# Patient Record
Sex: Female | Born: 1990 | Race: Black or African American | Hispanic: No | Marital: Married
Health system: Midwestern US, Community
[De-identification: ages and names within clinical notes are randomized; demographics above are authoritative.]

## PROBLEM LIST (undated history)

## (undated) ENCOUNTER — Inpatient Hospital Stay (HOSPITAL_COMMUNITY): Payer: Self-pay

## (undated) DIAGNOSIS — Z915 Personal history of self-harm: Secondary | ICD-10-CM

## (undated) DIAGNOSIS — F1210 Cannabis abuse, uncomplicated: Secondary | ICD-10-CM

## (undated) DIAGNOSIS — Z9151 Personal history of suicidal behavior: Secondary | ICD-10-CM

## (undated) DIAGNOSIS — F209 Schizophrenia, unspecified: Secondary | ICD-10-CM

## (undated) DIAGNOSIS — R569 Unspecified convulsions: Secondary | ICD-10-CM

## (undated) DIAGNOSIS — F1410 Cocaine abuse, uncomplicated: Secondary | ICD-10-CM

## (undated) DIAGNOSIS — F319 Bipolar disorder, unspecified: Secondary | ICD-10-CM

## (undated) HISTORY — PX: WISDOM TOOTH EXTRACTION: SHX21

## (undated) HISTORY — PX: NO PAST SURGERIES: SHX2092

---

## 2014-04-27 ENCOUNTER — Emergency Department (HOSPITAL_COMMUNITY)
Admission: EM | Admit: 2014-04-27 | Discharge: 2014-04-27 | Disposition: A | Discharge status: Home / Self Care | Payer: Medicaid Other | Attending: Emergency Medicine | Admitting: Emergency Medicine

## 2014-04-27 ENCOUNTER — Encounter (HOSPITAL_COMMUNITY): Payer: Self-pay | Admitting: Emergency Medicine

## 2014-04-27 ENCOUNTER — Emergency Department (HOSPITAL_COMMUNITY): Payer: Medicaid Other

## 2014-04-27 DIAGNOSIS — N760 Acute vaginitis: Secondary | ICD-10-CM | POA: Insufficient documentation

## 2014-04-27 DIAGNOSIS — N972 Female infertility of uterine origin: Secondary | ICD-10-CM | POA: Diagnosis present

## 2014-04-27 DIAGNOSIS — O209 Hemorrhage in early pregnancy, unspecified: Secondary | ICD-10-CM | POA: Insufficient documentation

## 2014-04-27 DIAGNOSIS — N390 Urinary tract infection, site not specified: Secondary | ICD-10-CM | POA: Diagnosis not present

## 2014-04-27 DIAGNOSIS — B9689 Other specified bacterial agents as the cause of diseases classified elsewhere: Secondary | ICD-10-CM | POA: Insufficient documentation

## 2014-04-27 DIAGNOSIS — O2390 Unspecified genitourinary tract infection in pregnancy, unspecified trimester: Secondary | ICD-10-CM | POA: Diagnosis not present

## 2014-04-27 DIAGNOSIS — A499 Bacterial infection, unspecified: Secondary | ICD-10-CM | POA: Insufficient documentation

## 2014-04-27 DIAGNOSIS — IMO0002 Reserved for concepts with insufficient information to code with codable children: Secondary | ICD-10-CM | POA: Diagnosis not present

## 2014-04-27 DIAGNOSIS — Z880 Allergy status to penicillin: Secondary | ICD-10-CM | POA: Insufficient documentation

## 2014-04-27 DIAGNOSIS — R300 Dysuria: Secondary | ICD-10-CM | POA: Diagnosis not present

## 2014-04-27 DIAGNOSIS — Z79899 Other long term (current) drug therapy: Secondary | ICD-10-CM | POA: Diagnosis not present

## 2014-04-27 LAB — URINE MICROSCOPIC-ADD ON

## 2014-04-27 LAB — I-STAT CHEM 8, ED
BUN: 4 mg/dL — AB (ref 6–23)
Calcium, Ion: 1.18 mmol/L (ref 1.12–1.23)
Chloride: 103 mEq/L (ref 96–112)
Creatinine, Ser: 0.6 mg/dL (ref 0.50–1.10)
Glucose, Bld: 81 mg/dL (ref 70–99)
HEMATOCRIT: 40 % (ref 36.0–46.0)
Hemoglobin: 13.6 g/dL (ref 12.0–15.0)
Potassium: 3.6 mEq/L — ABNORMAL LOW (ref 3.7–5.3)
Sodium: 137 mEq/L (ref 137–147)
TCO2: 23 mmol/L (ref 0–100)

## 2014-04-27 LAB — CBC WITH DIFFERENTIAL/PLATELET
Basophils Absolute: 0 10*3/uL (ref 0.0–0.1)
Basophils Relative: 0 % (ref 0–1)
EOS PCT: 0 % (ref 0–5)
Eosinophils Absolute: 0 10*3/uL (ref 0.0–0.7)
HEMATOCRIT: 36.2 % (ref 36.0–46.0)
HEMOGLOBIN: 12.6 g/dL (ref 12.0–15.0)
LYMPHS PCT: 28 % (ref 12–46)
Lymphs Abs: 3.2 10*3/uL (ref 0.7–4.0)
MCH: 29.4 pg (ref 26.0–34.0)
MCHC: 34.8 g/dL (ref 30.0–36.0)
MCV: 84.6 fL (ref 78.0–100.0)
MONO ABS: 0.7 10*3/uL (ref 0.1–1.0)
MONOS PCT: 6 % (ref 3–12)
Neutro Abs: 7.4 10*3/uL (ref 1.7–7.7)
Neutrophils Relative %: 66 % (ref 43–77)
Platelets: 252 10*3/uL (ref 150–400)
RBC: 4.28 MIL/uL (ref 3.87–5.11)
RDW: 13.5 % (ref 11.5–15.5)
WBC: 11.3 10*3/uL — ABNORMAL HIGH (ref 4.0–10.5)

## 2014-04-27 LAB — URINALYSIS, ROUTINE W REFLEX MICROSCOPIC
Bilirubin Urine: NEGATIVE
GLUCOSE, UA: NEGATIVE mg/dL
HGB URINE DIPSTICK: NEGATIVE
Ketones, ur: 15 mg/dL — AB
Nitrite: POSITIVE — AB
PROTEIN: 30 mg/dL — AB
Specific Gravity, Urine: 1.025 (ref 1.005–1.030)
Urobilinogen, UA: 1 mg/dL (ref 0.0–1.0)
pH: 6.5 (ref 5.0–8.0)

## 2014-04-27 LAB — HEPATIC FUNCTION PANEL
ALT: 14 U/L (ref 0–35)
AST: 14 U/L (ref 0–37)
Albumin: 3.9 g/dL (ref 3.5–5.2)
Alkaline Phosphatase: 56 U/L (ref 39–117)
BILIRUBIN TOTAL: 0.4 mg/dL (ref 0.3–1.2)
Total Protein: 7.4 g/dL (ref 6.0–8.3)

## 2014-04-27 LAB — WET PREP, GENITAL
TRICH WET PREP: NONE SEEN
WBC WET PREP: NONE SEEN
Yeast Wet Prep HPF POC: NONE SEEN

## 2014-04-27 LAB — HCG, QUANTITATIVE, PREGNANCY: HCG, BETA CHAIN, QUANT, S: 40503 m[IU]/mL — AB (ref <5)

## 2014-04-27 LAB — ABO/RH: ABO/RH(D): A POS

## 2014-04-27 LAB — TYPE AND SCREEN
ABO/RH(D): A POS
Antibody Screen: NEGATIVE

## 2014-04-27 LAB — PREGNANCY, URINE: Preg Test, Ur: POSITIVE — AB

## 2014-04-27 MED ORDER — METRONIDAZOLE 500 MG PO TABS
500.0000 mg | ORAL_TABLET | Freq: Two times a day (BID) | ORAL | Status: DC
Start: 2014-04-27 — End: 2015-03-24

## 2014-04-27 MED ORDER — SODIUM CHLORIDE 0.9 % IV BOLUS (SEPSIS)
1000.0000 mL | Freq: Once | INTRAVENOUS | Status: AC
  Administered 2014-04-27: 1000 mL via INTRAVENOUS

## 2014-04-27 MED ORDER — NITROFURANTOIN MONOHYD MACRO 100 MG PO CAPS
100.0000 mg | ORAL_CAPSULE | Freq: Two times a day (BID) | ORAL | Status: DC
Start: 2014-04-27 — End: 2015-03-24

## 2014-04-27 MED ORDER — ACETAMINOPHEN 325 MG PO TABS
650.0000 mg | ORAL_TABLET | Freq: Once | ORAL | Status: AC
  Administered 2014-04-27: 650 mg via ORAL
  Filled 2014-04-27: qty 2

## 2014-04-27 NOTE — ED Notes (Signed)
Pelvic cart set up at bedside. Pt undressed from waist down awaiting exam. Pt placed on monitor Pt monitored by blood pressure and pulse ox.

## 2014-04-27 NOTE — ED Notes (Signed)
Pt states increased abdominal pain after vaginal exam

## 2014-04-27 NOTE — ED Provider Notes (Signed)
CSN: [id number]     Arrival date & time 04/27/14  1315 History   First MD Initiated Contact with Patient 04/27/14 1522     Chief Complaint  Patient presents with  . Vaginal Bleeding  . Abdominal Cramping    Patient is a 23 y.o. female presenting with vaginal bleeding. The history is provided by the patient.  Vaginal Bleeding Quality:  Lighter than menses (No tissue) Severity:  Mild Onset quality:  Gradual Duration:  1 day Timing:  Intermittent Progression:  Unchanged Chronicity:  New Number of pads used:  1 Possible pregnancy: yes ([redacted]wks EGA)   Context: not after intercourse, not foreign body and not genital trauma   Relieved by:  None tried Worsened by:  Nothing tried Ineffective treatments:  None tried Associated symptoms: abdominal pain and dysuria   Associated symptoms: no back pain, no dizziness, no fatigue, no fever, no nausea and no vaginal discharge   Risk factors: prior miscarriage   Risk factors: no bleeding disorder, no hx of ectopic pregnancy and no gynecological surgery    [id number] 23 y.o. female who presents for VB and cramping since last night.  No drug use. Says she fell onto abdomen recently.  Blood type A +.     History reviewed. No pertinent past medical history. History reviewed. No pertinent past surgical history. History reviewed. No pertinent family history. History  Substance Use Topics  . Smoking status: Current Every Day Smoker  . Smokeless tobacco: Not on file  . Alcohol Use: No   Review of Systems  Constitutional: Negative for fever and fatigue.  Respiratory: Negative for chest tightness and shortness of breath.   Gastrointestinal: Positive for abdominal pain. Negative for nausea and vomiting.  Genitourinary: Positive for dysuria, vaginal bleeding and pelvic pain. Negative for flank pain, vaginal discharge and difficulty urinating.  Musculoskeletal: Negative for back pain.  Neurological: Negative for dizziness, syncope and light-headedness.     Allergies  Chocolate; Other; Peanut-containing drug products; and Penicillins  Home Medications   Prior to Admission medications   Medication Sig Start Date End Date Taking? Authorizing Provider  acetaminophen (TYLENOL) 500 MG tablet Take 1,000 mg by mouth every 6 (six) hours as needed for moderate pain or headache.   Yes Historical Provider, MD  Prenatal Vit-Fe Fumarate-FA (PRENATAL PO) Take 1 tablet by mouth daily.   Yes Historical Provider, MD   BP 115/66  Pulse 90  Temp(Src) 99.1 F (37.3 C) (Oral)  Resp 18  Ht  (1.651 m)  Wt 151 lb (68.493 kg)  BMI 25.13 kg/m2  SpO2 99% Physical Exam  Nursing note and vitals reviewed. Constitutional: She is oriented to person, place, and time. She appears well-developed and well-nourished. No distress.  HENT:  Head: Normocephalic and atraumatic.  Nose: Nose normal.  Eyes: Conjunctivae are normal.  No conj pallor  Neck: Normal range of motion. Neck supple. No tracheal deviation present.  Cardiovascular: Normal rate, regular rhythm and normal heart sounds.   No murmur heard. Pulmonary/Chest: Effort normal and breath sounds normal. No respiratory distress. She has no rales.  Abdominal: Soft. Bowel sounds are normal. She exhibits no distension and no mass. There is tenderness (b/l lower quadrants).  Neg psoas, Rovsign, obturator signs  Genitourinary:  Os closed, no bleeding. No dc per os, but vagina with clear discharge with white clumps. No erythema. Cervix w/o lesions  Musculoskeletal: Normal range of motion. She exhibits no edema.  Neurological: She is alert and oriented to person, place, and time.  Skin: Skin is warm and dry. No rash noted.  Psychiatric: She has a normal mood and affect.    ED Course  Procedures (including critical care time) Labs Review Labs Reviewed  WET PREP, GENITAL - Abnormal; Notable for the following:    Clue Cells Wet Prep HPF POC FEW (*)    All other components within normal limits  URINALYSIS,  ROUTINE W REFLEX MICROSCOPIC - Abnormal; Notable for the following:    Color, Urine AMBER (*)    APPearance CLOUDY (*)    Ketones, ur 15 (*)    Protein, ur 30 (*)    Nitrite POSITIVE (*)    Leukocytes, UA SMALL (*)    All other components within normal limits  HCG, QUANTITATIVE, PREGNANCY - Abnormal; Notable for the following:    hCG, Beta Chain, Quant, S 16109 (*)    All other components within normal limits  CBC WITH DIFFERENTIAL - Abnormal; Notable for the following:    WBC 11.3 (*)    All other components within normal limits  PREGNANCY, URINE - Abnormal; Notable for the following:    Preg Test, Ur POSITIVE (*)    All other components within normal limits  URINE MICROSCOPIC-ADD ON - Abnormal; Notable for the following:    Squamous Epithelial / LPF FEW (*)    Bacteria, UA MANY (*)    All other components within normal limits  I-STAT CHEM 8, ED - Abnormal; Notable for the following:    Potassium 3.6 (*)    BUN 4 (*)    All other components within normal limits  URINE CULTURE  GC/CHLAMYDIA PROBE AMP  HEPATIC FUNCTION PANEL  TYPE AND SCREEN  ABO/RH    Imaging Review US Ob Comp Less 14 Wks  04/27/2014   CLINICAL DATA:  Abnormal stress abdominal cramping and vaginal bleeding.  EXAM: OBSTETRIC <14 WK [place] AND TRANSVAGINAL OB US  TECHNIQUE: Both transabdominal and transvaginal ultrasound examinations were performed for complete evaluation of the gestation as well as the maternal uterus, adnexal regions, and pelvic cul-de-sac. Transvaginal technique was performed to assess early pregnancy.  COMPARISON:  Pelvic ultrasound 04/07/2014  FINDINGS: Intrauterine gestational sac: Visualized/normal in shape.  Yolk sac:  Present  Embryo:  Present  Cardiac Activity: Present  Heart Rate:  147 bpm  CRL:   8  mm   6 w 5 d                  [place] EDC: 12/15/2013  Maternal uterus/adnexae: No subchorionic hematoma. The right and left ovary are grossly unremarkable.  IMPRESSION: Single live intrauterine  gestation 6 weeks 5 days by LMP.   Electronically Signed   By: Annia Belt M.D.   On: 04/27/2014 16:46   US Ob Transvaginal  04/27/2014   CLINICAL DATA:  Abnormal stress abdominal cramping and vaginal bleeding.  EXAM: OBSTETRIC <14 WK [place] AND TRANSVAGINAL OB US  TECHNIQUE: Both transabdominal and transvaginal ultrasound examinations were performed for complete evaluation of the gestation as well as the maternal uterus, adnexal regions, and pelvic cul-de-sac. Transvaginal technique was performed to assess early pregnancy.  COMPARISON:  Pelvic ultrasound 04/07/2014  FINDINGS: Intrauterine gestational sac: Visualized/normal in shape.  Yolk sac:  Present  Embryo:  Present  Cardiac Activity: Present  Heart Rate:  147 bpm  CRL:   8  mm   6 w 5 d                  [place] EDC: 12/15/2013  Maternal  uterus/adnexae: No subchorionic hematoma. The right and left ovary are grossly unremarkable.  IMPRESSION: Single live intrauterine gestation 6 weeks 5 days by LMP.   Electronically Signed   By: Annia Belt M.D.   On: 04/27/2014 16:46     EKG Interpretation None      MDM   Final diagnoses:  Vaginal bleeding in pregnancy, first trimester  UTI (lower urinary tract infection)  BV (bacterial vaginosis)    [id number] with 1 day small VB with pelvic cramping, feels like prior miscarriage. Gave NS bolus for cramping. Notes dysuria and had UTI 2 wks ago which was treated.  Endorses mild trauma, tripping and falling onto abdomen. VSS. A+ blood type, no RhoGam needed. Abd benign.  No signs of systemic toxicity or peritonits to suggest surgical emergency.  Pelvic exam without bleeding, os closed, white and clear discharge in vaginal vault.    Ectopic vs miscarriage. Doubt appy, non toxic appearing, no appy signs.   UA postive for infx markers. Treat for UTI and BV in setting of pregnancy with Macrobid and Flagyl. Pelvix u/s confirms intrauterine pregnancy at [redacted]w[redacted]d with +FHT.   F/u in 2 days with Neal Dy,  MD 04/28/14 [phone]

## 2014-04-27 NOTE — ED Provider Notes (Signed)
Pt seen and evaluated.  D/W Dr. Lillard Anes.  Pt with closed os.  IUP via [place].  Plan is home.  OB f/u (pt has appointment 9-9 at [facility]).  Benign exam. Hemodynamically stable. Was hydrated here.  Rolland Porter, MD 04/27/14 [phone]

## 2014-04-27 NOTE — ED Notes (Signed)
Reports vaginal bleeding and cramping since last night, pt is [redacted] weeks pregnant.

## 2014-04-27 NOTE — ED Notes (Signed)
Pt placed on monitor upon arrival to room. Pt monitored by blood pressure and pulse ox.  

## 2014-04-27 NOTE — ED Notes (Signed)
Pregnancy urine sent to main lab as add on

## 2014-04-28 LAB — GC/CHLAMYDIA PROBE AMP
CT PROBE, AMP APTIMA: NEGATIVE
GC Probe RNA: NEGATIVE

## 2014-04-30 LAB — URINE CULTURE

## 2014-05-01 ENCOUNTER — Telehealth (HOSPITAL_BASED_OUTPATIENT_CLINIC_OR_DEPARTMENT_OTHER): Payer: Self-pay

## 2014-05-01 NOTE — Telephone Encounter (Signed)
Post ED Visit - Positive Culture Follow-up  Culture report reviewed by antimicrobial stewardship pharmacist:  Wes Dulaney, Pharm.D., BCPS  Celedonio Miyamoto, [address].D., BCPS  Georgina Pillion, [address].D., BCPS  [place], [address].D., BCPS, AAHIVP  Estella Husk, Pharm.D., BCPS, AAHIVP  Red Christians, Pharm.D.  Tennis Must, [place].D.  Positive Urine culture, >/= 100,000 colonies -> E Coli Treated with Nitrofurantoin , organism sensitive to the same and no further patient follow-up is required at this time.  Arvid Right 05/01/2014, 10:22 PM

## 2014-05-04 NOTE — ED Provider Notes (Signed)
I saw and evaluated the patient, reviewed the resident's note and I agree with the findings and plan.   EKG Interpretation None      Please see my additional dictation  Rolland Porter, MD 05/04/14 2115

## 2014-07-04 ENCOUNTER — Encounter (HOSPITAL_COMMUNITY): Payer: Self-pay | Admitting: Emergency Medicine

## 2014-11-17 ENCOUNTER — Encounter (HOSPITAL_COMMUNITY): Payer: Self-pay | Admitting: *Deleted

## 2014-11-17 ENCOUNTER — Inpatient Hospital Stay (HOSPITAL_COMMUNITY)
Admission: AD | Admit: 2014-11-17 | Discharge: 2014-11-17 | Disposition: A | Discharge status: Home / Self Care | Payer: Medicaid Other | Source: Ambulatory Visit | Attending: Obstetrics and Gynecology | Admitting: Obstetrics and Gynecology

## 2014-11-17 DIAGNOSIS — Z3A37 37 weeks gestation of pregnancy: Secondary | ICD-10-CM | POA: Diagnosis not present

## 2014-11-17 HISTORY — DX: Unspecified convulsions: R56.9

## 2014-11-17 NOTE — MAU Note (Signed)
Urine in lab 

## 2014-11-17 NOTE — MAU Note (Signed)
[place] [facility] [place], was up here at boyfriends. Has been contracting all day, now about 7min apart.  Stayed about the same all day. No  Bleeding or leaking.   Was not dilated when last checked. Treated last wk for chlamydia

## 2014-11-19 ENCOUNTER — Inpatient Hospital Stay (HOSPITAL_COMMUNITY)
Admission: AD | Admit: 2014-11-19 | Discharge: 2014-11-19 | Disposition: A | Discharge status: Home / Self Care | Payer: Medicaid Other | Source: Ambulatory Visit | Attending: Obstetrics and Gynecology | Admitting: Obstetrics and Gynecology

## 2014-11-19 ENCOUNTER — Encounter (HOSPITAL_COMMUNITY): Payer: Self-pay

## 2014-11-19 DIAGNOSIS — Z3A37 37 weeks gestation of pregnancy: Secondary | ICD-10-CM | POA: Insufficient documentation

## 2014-11-19 DIAGNOSIS — O368130 Decreased fetal movements, third trimester, not applicable or unspecified: Secondary | ICD-10-CM | POA: Diagnosis not present

## 2014-11-19 NOTE — MAU Note (Signed)
Pt presents complaining of contractions every 2-3 minutes since Friday. Denies leaking or bleeding. Reports decreased fetal movement. [place] but [facility]

## 2014-11-19 NOTE — Discharge Instructions (Signed)
Third Trimester of Pregnancy °The third trimester is from week 29 through week 42, months 7 through 9. The third trimester is a time when the fetus is growing rapidly. At the end of the ninth month, the fetus is about 20 inches in length and weighs 6-10 pounds.  °BODY CHANGES °Your body goes through many changes during pregnancy. The changes vary from woman to woman.  °· Your weight will continue to increase. You can expect to gain 25-35 pounds (11-16 kg) by the end of the pregnancy. °· You may begin to get stretch marks on your hips, abdomen, and breasts. °· You may urinate more often because the fetus is moving lower into your pelvis and pressing on your bladder. °· You may develop or continue to have heartburn as a result of your pregnancy. °· You may develop constipation because certain hormones are causing the muscles that push waste through your intestines to slow down. °· You may develop hemorrhoids or swollen, bulging veins (varicose veins). °· You may have pelvic pain because of the weight gain and pregnancy hormones relaxing your joints between the bones in your pelvis. Backaches may result from overexertion of the muscles supporting your posture. °· You may have changes in your hair. These can include thickening of your hair, rapid growth, and changes in texture. Some women also have hair loss during or after pregnancy, or hair that feels dry or thin. Your hair will most likely return to normal after your baby is born. °· Your breasts will continue to grow and be tender. A yellow discharge may leak from your breasts called colostrum. °· Your belly button may stick out. °· You may feel short of breath because of your expanding uterus. °· You may notice the fetus "dropping," or moving lower in your abdomen. °· You may have a bloody mucus discharge. This usually occurs a few days to a week before labor begins. °· Your cervix becomes thin and soft (effaced) near your due date. °WHAT TO EXPECT AT YOUR PRENATAL  EXAMS  °You will have prenatal exams every 2 weeks until week 36. Then, you will have weekly prenatal exams. During a routine prenatal visit: °· You will be weighed to make sure you and the fetus are growing normally. °· Your blood pressure is taken. °· Your abdomen will be measured to track your baby's growth. °· The fetal heartbeat will be listened to. °· Any test results from the previous visit will be discussed. °· You may have a cervical check near your due date to see if you have effaced. °At around 36 weeks, your caregiver will check your cervix. At the same time, your caregiver will also perform a test on the secretions of the vaginal tissue. This test is to determine if a type of bacteria, Group B streptococcus, is present. Your caregiver will explain this further. °Your caregiver may ask you: °· What your birth plan is. °· How you are feeling. °· If you are feeling the baby move. °· If you have had any abnormal symptoms, such as leaking fluid, bleeding, severe headaches, or abdominal cramping. °· If you have any questions. °Other tests or screenings that may be performed during your third trimester include: °· Blood tests that check for low iron levels (anemia). °· Fetal testing to check the health, activity level, and growth of the fetus. Testing is done if you have certain medical conditions or if there are problems during the pregnancy. °FALSE LABOR °You may feel small, irregular contractions that   eventually go away. These are called Braxton Hicks contractions, or false labor. Contractions may last for hours, days, or even weeks before true labor sets in. If contractions come at regular intervals, intensify, or become painful, it is best to be seen by your caregiver.  °SIGNS OF LABOR  °· Menstrual-like cramps. °· Contractions that are 5 minutes apart or less. °· Contractions that start on the top of the uterus and spread down to the lower abdomen and back. °· A sense of increased pelvic pressure or back  pain. °· A watery or bloody mucus discharge that comes from the vagina. °If you have any of these signs before the 37th week of pregnancy, call your caregiver right away. You need to go to the hospital to get checked immediately. °HOME CARE INSTRUCTIONS  °· Avoid all smoking, herbs, alcohol, and unprescribed drugs. These chemicals affect the formation and growth of the baby. °· Follow your caregiver's instructions regarding medicine use. There are medicines that are either safe or unsafe to take during pregnancy. °· Exercise only as directed by your caregiver. Experiencing uterine cramps is a good sign to stop exercising. °· Continue to eat regular, healthy meals. °· Wear a good support bra for breast tenderness. °· Do not use hot tubs, steam rooms, or saunas. °· Wear your seat belt at all times when driving. °· Avoid raw meat, uncooked cheese, cat litter boxes, and soil used by cats. These carry germs that can cause birth defects in the baby. °· Take your prenatal vitamins. °· Try taking a stool softener (if your caregiver approves) if you develop constipation. Eat more high-fiber foods, such as fresh vegetables or fruit and whole grains. Drink plenty of fluids to keep your urine clear or pale yellow. °· Take warm sitz baths to soothe any pain or discomfort caused by hemorrhoids. Use hemorrhoid cream if your caregiver approves. °· If you develop varicose veins, wear support hose. Elevate your feet for 15 minutes, 3-4 times a day. Limit salt in your diet. °· Avoid heavy lifting, wear low heal shoes, and practice good posture. °· Rest a lot with your legs elevated if you have leg cramps or low back pain. °· Visit your dentist if you have not gone during your pregnancy. Use a soft toothbrush to brush your teeth and be gentle when you floss. °· A sexual relationship may be continued unless your caregiver directs you otherwise. °· Do not travel far distances unless it is absolutely necessary and only with the approval  of your caregiver. °· Take prenatal classes to understand, practice, and ask questions about the labor and delivery. °· Make a trial run to the hospital. °· Pack your hospital bag. °· Prepare the baby's nursery. °· Continue to go to all your prenatal visits as directed by your caregiver. °SEEK MEDICAL CARE IF: °· You are unsure if you are in labor or if your water has broken. °· You have dizziness. °· You have mild pelvic cramps, pelvic pressure, or nagging pain in your abdominal area. °· You have persistent nausea, vomiting, or diarrhea. °· You have a bad smelling vaginal discharge. °· You have pain with urination. °SEEK IMMEDIATE MEDICAL CARE IF:  °· You have a fever. °· You are leaking fluid from your vagina. °· You have spotting or bleeding from your vagina. °· You have severe abdominal cramping or pain. °· You have rapid weight loss or gain. °· You have shortness of breath with chest pain. °· You notice sudden or extreme swelling   of your face, hands, ankles, feet, or legs. °· You have not felt your baby move in over an hour. °· You have severe headaches that do not go away with medicine. °· You have vision changes. °Document Released: 08/12/2001 Document Revised: 08/23/2013 Document Reviewed: 10/19/2012 °[employer]® Patient Information ©2015 [employer]. This information is not intended to replace advice given to you by your health care provider. Make sure you discuss any questions you have with your health care provider. °Fetal Movement Counts °Patient Name: __________________________________________________ Patient Due Date: ____________________ °Performing a fetal movement count is highly recommended in high-risk pregnancies, but it is good for every pregnant woman to do. Your health care provider may ask you to start counting fetal movements at 28 weeks of the pregnancy. Fetal movements often increase: °· After eating a full meal. °· After physical activity. °· After eating or drinking something sweet or  cold. °· At rest. °Pay attention to when you feel the baby is most active. This will help you notice a pattern of your baby's sleep and wake cycles and what factors contribute to an increase in fetal movement. It is important to perform a fetal movement count at the same time each day when your baby is normally most active.  °HOW TO COUNT FETAL MOVEMENTS °· Find a quiet and comfortable area to sit or lie down on your left side. Lying on your left side provides the best blood and oxygen circulation to your baby. °· Write down the day and time on a sheet of paper or in a journal. °· Start counting kicks, flutters, swishes, rolls, or jabs in a 2-hour period. You should feel at least 10 movements within 2 hours. °· If you do not feel 10 movements in 2 hours, wait 2-3 hours and count again. Look for a change in the pattern or not enough counts in 2 hours. °SEEK MEDICAL CARE IF: °· You feel less than 10 counts in 2 hours, tried twice. °· There is no movement in over an hour. °· The pattern is changing or taking longer each day to reach 10 counts in 2 hours. °· You feel the baby is not moving as he or she usually does. °Date: ____________ Movements: ____________ Start time: ____________ Finish time: ____________  °Date: ____________ Movements: ____________ Start time: ____________ Finish time: ____________ °Date: ____________ Movements: ____________ Start time: ____________ Finish time: ____________ °Date: ____________ Movements: ____________ Start time: ____________ Finish time: ____________ °Date: ____________ Movements: ____________ Start time: ____________ Finish time: ____________ °Date: ____________ Movements: ____________ Start time: ____________ Finish time: ____________ °Date: ____________ Movements: ____________ Start time: ____________ Finish time: ____________ °Date: ____________ Movements: ____________ Start time: ____________ Finish time: ____________  °Date: ____________ Movements: ____________ Start time:  ____________ Finish time: ____________ °Date: ____________ Movements: ____________ Start time: ____________ Finish time: ____________ °Date: ____________ Movements: ____________ Start time: ____________ Finish time: ____________ °Date: ____________ Movements: ____________ Start time: ____________ Finish time: ____________ °Date: ____________ Movements: ____________ Start time: ____________ Finish time: ____________ °Date: ____________ Movements: ____________ Start time: ____________ Finish time: ____________ °Date: ____________ Movements: ____________ Start time: ____________ Finish time: ____________  °Date: ____________ Movements: ____________ Start time: ____________ Finish time: ____________ °Date: ____________ Movements: ____________ Start time: ____________ Finish time: ____________ °Date: ____________ Movements: ____________ Start time: ____________ Finish time: ____________ °Date: ____________ Movements: ____________ Start time: ____________ Finish time: ____________ °Date: ____________ Movements: ____________ Start time: ____________ Finish time: ____________ °Date: ____________ Movements: ____________ Start time: ____________ Finish time: ____________ °Date: ____________ Movements: ____________ Start time: ____________ Finish time:   ____________  °Date: ____________ Movements: ____________ Start time: ____________ Finish time: ____________ °Date: ____________ Movements: ____________ Start time: ____________ Finish time: ____________ °Date: ____________ Movements: ____________ Start time: ____________ Finish time: ____________ °Date: ____________ Movements: ____________ Start time: ____________ Finish time: ____________ °Date: ____________ Movements: ____________ Start time: ____________ Finish time: ____________ °Date: ____________ Movements: ____________ Start time: ____________ Finish time: ____________ °Date: ____________ Movements: ____________ Start time: ____________ Finish time: ____________  °Date:  ____________ Movements: ____________ Start time: ____________ Finish time: ____________ °Date: ____________ Movements: ____________ Start time: ____________ Finish time: ____________ °Date: ____________ Movements: ____________ Start time: ____________ Finish time: ____________ °Date: ____________ Movements: ____________ Start time: ____________ Finish time: ____________ °Date: ____________ Movements: ____________ Start time: ____________ Finish time: ____________ °Date: ____________ Movements: ____________ Start time: ____________ Finish time: ____________ °Date: ____________ Movements: ____________ Start time: ____________ Finish time: ____________  °Date: ____________ Movements: ____________ Start time: ____________ Finish time: ____________ °Date: ____________ Movements: ____________ Start time: ____________ Finish time: ____________ °Date: ____________ Movements: ____________ Start time: ____________ Finish time: ____________ °Date: ____________ Movements: ____________ Start time: ____________ Finish time: ____________ °Date: ____________ Movements: ____________ Start time: ____________ Finish time: ____________ °Date: ____________ Movements: ____________ Start time: ____________ Finish time: ____________ °Date: ____________ Movements: ____________ Start time: ____________ Finish time: ____________  °Date: ____________ Movements: ____________ Start time: ____________ Finish time: ____________ °Date: ____________ Movements: ____________ Start time: ____________ Finish time: ____________ °Date: ____________ Movements: ____________ Start time: ____________ Finish time: ____________ °Date: ____________ Movements: ____________ Start time: ____________ Finish time: ____________ °Date: ____________ Movements: ____________ Start time: ____________ Finish time: ____________ °Date: ____________ Movements: ____________ Start time: ____________ Finish time: ____________ °Date: ____________ Movements: ____________ Start  time: ____________ Finish time: ____________  °Date: ____________ Movements: ____________ Start time: ____________ Finish time: ____________ °Date: ____________ Movements: ____________ Start time: ____________ Finish time: ____________ °Date: ____________ Movements: ____________ Start time: ____________ Finish time: ____________ °Date: ____________ Movements: ____________ Start time: ____________ Finish time: ____________ °Date: ____________ Movements: ____________ Start time: ____________ Finish time: ____________ °Date: ____________ Movements: ____________ Start time: ____________ Finish time: ____________ °Document Released: 09/17/2006 Document Revised: 01/02/2014 Document Reviewed: 06/14/2012 °[employer]® Patient Information ©2015 [employer]. This information is not intended to replace advice given to you by your health care provider. Make sure you discuss any questions you have with your health care provider. °Braxton Hicks Contractions °Contractions of the uterus can occur throughout pregnancy. Contractions are not always a sign that you are in labor.  °WHAT ARE BRAXTON HICKS CONTRACTIONS?  °Contractions that occur before labor are called Braxton Hicks contractions, or false labor. Toward the end of pregnancy (32-34 weeks), these contractions can develop more often and may become more forceful. This is not true labor because these contractions do not result in opening (dilatation) and thinning of the cervix. They are sometimes difficult to tell apart from true labor because these contractions can be forceful and people have different pain tolerances. You should not feel embarrassed if you go to the hospital with false labor. Sometimes, the only way to tell if you are in true labor is for your health care provider to look for changes in the cervix. °If there are no prenatal problems or other health problems associated with the pregnancy, it is completely safe to be sent home with false labor and await the  onset of true labor. °HOW CAN YOU TELL THE DIFFERENCE BETWEEN TRUE AND FALSE LABOR? °False Labor °· The contractions of false labor are usually shorter and not as hard as those of true labor.   °· The contractions   are usually irregular.   °· The contractions are often felt in the front of the lower abdomen and in the groin.   °· The contractions may go away when you walk around or change positions while lying down.   °· The contractions get weaker and are shorter lasting as time goes on.   °· The contractions do not usually become progressively stronger, regular, and closer together as with true labor.   °True Labor °· Contractions in true labor last 30-70 seconds, become very regular, usually become more intense, and increase in frequency.   °· The contractions do not go away with walking.   °· The discomfort is usually felt in the top of the uterus and spreads to the lower abdomen and low back.   °· True labor can be determined by your health care provider with an exam. This will show that the cervix is dilating and getting thinner.   °WHAT TO REMEMBER °· Keep up with your usual exercises and follow other instructions given by your health care provider.   °· Take medicines as directed by your health care provider.   °· Keep your regular prenatal appointments.   °· Eat and drink lightly if you think you are going into labor.   °· If Braxton Hicks contractions are making you uncomfortable:   °· Change your position from lying down or resting to walking, or from walking to resting.   °· Sit and rest in a tub of warm water.   °· Drink 2-3 glasses of water. Dehydration may cause these contractions.   °· Do slow and deep breathing several times an hour.   °WHEN SHOULD I SEEK IMMEDIATE MEDICAL CARE? °Seek immediate medical care if: °· Your contractions become stronger, more regular, and closer together.   °· You have fluid leaking or gushing from your vagina.   °· You have a fever.   °· You pass blood-tinged mucus.    °· You have vaginal bleeding.   °· You have continuous abdominal pain.   °· You have low back pain that you never had before.   °· You feel your baby's head pushing down and causing pelvic pressure.   °· Your baby is not moving as much as it used to.   °Document Released: 08/18/2005 Document Revised: 08/23/2013 Document Reviewed: 05/30/2013 °[employer]® Patient Information ©2015 [employer]. This information is not intended to replace advice given to you by your health care provider. Make sure you discuss any questions you have with your health care provider. ° °

## 2015-03-23 ENCOUNTER — Encounter (HOSPITAL_COMMUNITY): Payer: Self-pay | Admitting: Emergency Medicine

## 2015-03-23 DIAGNOSIS — Y9389 Activity, other specified: Secondary | ICD-10-CM | POA: Diagnosis not present

## 2015-03-23 DIAGNOSIS — Z23 Encounter for immunization: Secondary | ICD-10-CM | POA: Diagnosis not present

## 2015-03-23 DIAGNOSIS — Y9289 Other specified places as the place of occurrence of the external cause: Secondary | ICD-10-CM | POA: Insufficient documentation

## 2015-03-23 DIAGNOSIS — Y288XXA Contact with other sharp object, undetermined intent, initial encounter: Secondary | ICD-10-CM | POA: Diagnosis not present

## 2015-03-23 DIAGNOSIS — Z3202 Encounter for pregnancy test, result negative: Secondary | ICD-10-CM | POA: Diagnosis not present

## 2015-03-23 DIAGNOSIS — S90812A Abrasion, left foot, initial encounter: Secondary | ICD-10-CM | POA: Insufficient documentation

## 2015-03-23 DIAGNOSIS — R45851 Suicidal ideations: Secondary | ICD-10-CM | POA: Diagnosis not present

## 2015-03-23 DIAGNOSIS — S60410A Abrasion of right index finger, initial encounter: Secondary | ICD-10-CM | POA: Diagnosis not present

## 2015-03-23 DIAGNOSIS — Z720 Tobacco use: Secondary | ICD-10-CM | POA: Diagnosis not present

## 2015-03-23 DIAGNOSIS — S60412A Abrasion of right middle finger, initial encounter: Secondary | ICD-10-CM | POA: Insufficient documentation

## 2015-03-23 DIAGNOSIS — Y998 Other external cause status: Secondary | ICD-10-CM | POA: Diagnosis not present

## 2015-03-23 DIAGNOSIS — Z880 Allergy status to penicillin: Secondary | ICD-10-CM | POA: Diagnosis not present
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
## 2015-03-24 ENCOUNTER — Emergency Department (HOSPITAL_COMMUNITY): Payer: Medicaid Other

## 2015-03-24 ENCOUNTER — Encounter (HOSPITAL_COMMUNITY): Payer: Self-pay | Admitting: *Deleted

## 2015-03-24 ENCOUNTER — Emergency Department (HOSPITAL_COMMUNITY)
Admission: EM | Admit: 2015-03-24 | Discharge: 2015-03-25 | Disposition: A | Discharge status: Other Acute Inpatient Hospital | Payer: Medicaid Other | Attending: Emergency Medicine | Admitting: Emergency Medicine

## 2015-03-24 DIAGNOSIS — R45851 Suicidal ideations: Secondary | ICD-10-CM

## 2015-03-24 HISTORY — DX: Personal history of self-harm: Z91.5

## 2015-03-24 HISTORY — DX: Cocaine abuse, uncomplicated: F14.10

## 2015-03-24 HISTORY — DX: Personal history of suicidal behavior: Z91.51

## 2015-03-24 HISTORY — DX: Cannabis abuse, uncomplicated: F12.10

## 2015-03-24 LAB — RAPID URINE DRUG SCREEN, HOSP PERFORMED
Amphetamines: NOT DETECTED
BENZODIAZEPINES: NOT DETECTED
Barbiturates: NOT DETECTED
Cocaine: POSITIVE — AB
Opiates: NOT DETECTED
Tetrahydrocannabinol: POSITIVE — AB

## 2015-03-24 LAB — URINALYSIS, ROUTINE W REFLEX MICROSCOPIC
Glucose, UA: NEGATIVE mg/dL
HGB URINE DIPSTICK: NEGATIVE
Ketones, ur: 15 mg/dL — AB
Leukocytes, UA: NEGATIVE
NITRITE: NEGATIVE
PH: 5.5 (ref 5.0–8.0)
Protein, ur: 30 mg/dL — AB
SPECIFIC GRAVITY, URINE: 1.036 — AB (ref 1.005–1.030)
Urobilinogen, UA: 1 mg/dL (ref 0.0–1.0)

## 2015-03-24 LAB — COMPREHENSIVE METABOLIC PANEL
ALBUMIN: 4.1 g/dL (ref 3.5–5.0)
ALT: 21 U/L (ref 14–54)
AST: 16 U/L (ref 15–41)
Alkaline Phosphatase: 82 U/L (ref 38–126)
Anion gap: 12 (ref 5–15)
BUN: 11 mg/dL (ref 6–20)
CALCIUM: 9.2 mg/dL (ref 8.9–10.3)
CO2: 22 mmol/L (ref 22–32)
Chloride: 106 mmol/L (ref 101–111)
Creatinine, Ser: 0.93 mg/dL (ref 0.44–1.00)
GFR calc Af Amer: >60 mL/min (ref >60)
GFR calc non Af Amer: >60 mL/min (ref >60)
GLUCOSE: 115 mg/dL — AB (ref 65–99)
Potassium: 3.5 mmol/L (ref 3.5–5.1)
Sodium: 140 mmol/L (ref 135–145)
TOTAL PROTEIN: 7.2 g/dL (ref 6.5–8.1)
Total Bilirubin: 0.6 mg/dL (ref 0.3–1.2)

## 2015-03-24 LAB — URINE MICROSCOPIC-ADD ON

## 2015-03-24 LAB — CBC WITH DIFFERENTIAL/PLATELET
BASOS PCT: 0 % (ref 0–1)
Basophils Absolute: 0 10*3/uL (ref 0.0–0.1)
EOS ABS: 0.3 10*3/uL (ref 0.0–0.7)
EOS PCT: 3 % (ref 0–5)
HCT: 41.5 % (ref 36.0–46.0)
Hemoglobin: 13.7 g/dL (ref 12.0–15.0)
LYMPHS PCT: 29 % (ref 12–46)
Lymphs Abs: 3.4 10*3/uL (ref 0.7–4.0)
MCH: 28 pg (ref 26.0–34.0)
MCHC: 33 g/dL (ref 30.0–36.0)
MCV: 84.7 fL (ref 78.0–100.0)
MONO ABS: 0.7 10*3/uL (ref 0.1–1.0)
Monocytes Relative: 6 % (ref 3–12)
NEUTROS ABS: 7.1 10*3/uL (ref 1.7–7.7)
Neutrophils Relative %: 62 % (ref 43–77)
PLATELETS: 289 10*3/uL (ref 150–400)
RBC: 4.9 MIL/uL (ref 3.87–5.11)
RDW: 14.7 % (ref 11.5–15.5)
WBC: 11.6 10*3/uL — AB (ref 4.0–10.5)

## 2015-03-24 LAB — ETHANOL: Alcohol, Ethyl (B): <5 mg/dL (ref <5)

## 2015-03-24 LAB — PREGNANCY, URINE: PREG TEST UR: NEGATIVE

## 2015-03-24 MED ORDER — ACETAMINOPHEN 325 MG PO TABS: 650.0000 mg | ORAL_TABLET | Freq: Four times a day (QID) | ORAL | Status: DC | PRN

## 2015-03-24 MED ORDER — TETANUS-DIPHTH-ACELL PERTUSSIS 5-2.5-18.5 LF-MCG/0.5 IM SUSP
0.5000 mL | Freq: Once | INTRAMUSCULAR | Status: AC
  Administered 2015-03-24: 0.5 mL via INTRAMUSCULAR
  Filled 2015-03-24: qty 0.5

## 2015-03-24 MED ORDER — IBUPROFEN 400 MG PO TABS
600.0000 mg | ORAL_TABLET | Freq: Four times a day (QID) | ORAL | Status: DC | PRN
  Administered 2015-03-24: 600 mg via ORAL
  Filled 2015-03-24 (×2): qty 1
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
## 2015-03-24 NOTE — ED Notes (Signed)
[place], MD at bedside. 

## 2015-03-24 NOTE — ED Notes (Signed)
[employer] remains at bedside. 

## 2015-03-24 NOTE — ED Notes (Signed)
Pt ended up eating all her dinner.

## 2015-03-24 NOTE — ED Notes (Signed)
Lunch tray delivered to bedside. 

## 2015-03-24 NOTE — ED Notes (Signed)
PT'S "BABY'S DADDY - CESAR MATTISON" called requesting to speak w/pt. Advised him can give her the message. - [phone]. Advised [employer] Officers x 2 at pt's bedside - they advised pt may not have any phone calls at this time.
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
## 2015-03-24 NOTE — Progress Notes (Signed)
Disposition CSW completed referrals to the following inpatient psych facilities:  [facility]  CSW will continue to assist with patient's placement needs.  [facility] Disposition CSW [phone]
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
## 2015-03-24 NOTE — ED Notes (Signed)
Spoke w/Eric, AC, [facility], and [place], SW, [facility] - advised attempting to get NP to assess pt. Advised them [employer] Officers remain w/pt. Michel Bickers, AD, Charge RN, aware of delay.
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
## 2015-03-24 NOTE — ED Notes (Signed)
Meal tray delivered to pt but pt reports she doesn't want to eat anything and was not thirsty.  Meal tray remains at bedside for pt.

## 2015-03-24 NOTE — ED Notes (Signed)
NO VISITORS FOR PT PER [employer] OFFICERS.

## 2015-03-24 NOTE — ED Notes (Signed)
Per Rodena Medin, denied pt d/t Medicaid and pt's actions.
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
## 2015-03-24 NOTE — ED Notes (Signed)
Tele psych being performed at this time  

## 2015-03-24 NOTE — ED Notes (Signed)
Patient's left wrist cuffed to siderail by [employer] Ra[place] at the bedside. Dr. Ranae Palms aware, no new orders.

## 2015-03-24 NOTE — ED Notes (Addendum)
Mariah Mccarthy, pt's "baby's daddy" called requesting if may visit w/pt - advised no as per [facility] Officers request.

## 2015-03-24 NOTE — ED Notes (Signed)
Patient transported to X-ray 

## 2015-03-24 NOTE — ED Notes (Signed)
Pt changed into paper scrubs.

## 2015-03-24 NOTE — ED Notes (Signed)
[facility] PAPERS ON CHART - FAXED COPY TO [facility] - COPY SENT TO MEDICAL RECORDS.
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
## 2015-03-24 NOTE — ED Notes (Signed)
IVC papers returned from [facility] and placed on pt chart.

## 2015-03-24 NOTE — ED Notes (Signed)
TTS cart placed at bedside. 

## 2015-03-24 NOTE — ED Notes (Signed)
Lunch tray ordered 

## 2015-03-24 NOTE — ED Notes (Signed)
Breakfast tray ordered 

## 2015-03-24 NOTE — ED Notes (Signed)
Phlebotomy at the bedside  

## 2015-03-24 NOTE — ED Notes (Signed)
RN introduced self to pt, it appears she has eaten a small amount of her dinner and reports "I'm still working on it."  She did report that her hand is hurting (8out of ten).

## 2015-03-25 ENCOUNTER — Other Ambulatory Visit: Payer: Self-pay | Admitting: Nurse Practitioner

## 2015-03-25 ENCOUNTER — Encounter (HOSPITAL_COMMUNITY): Payer: Self-pay | Admitting: *Deleted

## 2015-03-25 ENCOUNTER — Inpatient Hospital Stay (HOSPITAL_COMMUNITY)
Admission: AD | Admit: 2015-03-25 | Discharge: 2015-04-04 | DRG: 885 | Payer: Medicaid Other | Source: Intra-hospital | Attending: Psychiatry | Admitting: Psychiatry

## 2015-03-25 DIAGNOSIS — R443 Hallucinations, unspecified: Secondary | ICD-10-CM | POA: Diagnosis present

## 2015-03-25 DIAGNOSIS — F1210 Cannabis abuse, uncomplicated: Secondary | ICD-10-CM | POA: Diagnosis present

## 2015-03-25 DIAGNOSIS — F4310 Post-traumatic stress disorder, unspecified: Secondary | ICD-10-CM | POA: Diagnosis present

## 2015-03-25 DIAGNOSIS — F1410 Cocaine abuse, uncomplicated: Secondary | ICD-10-CM | POA: Diagnosis present

## 2015-03-25 DIAGNOSIS — Z590 Homelessness: Secondary | ICD-10-CM

## 2015-03-25 DIAGNOSIS — F419 Anxiety disorder, unspecified: Secondary | ICD-10-CM | POA: Diagnosis present

## 2015-03-25 DIAGNOSIS — F1010 Alcohol abuse, uncomplicated: Secondary | ICD-10-CM | POA: Diagnosis present

## 2015-03-25 DIAGNOSIS — F332 Major depressive disorder, recurrent severe without psychotic features: Secondary | ICD-10-CM | POA: Diagnosis present

## 2015-03-25 DIAGNOSIS — G4700 Insomnia, unspecified: Secondary | ICD-10-CM | POA: Diagnosis present

## 2015-03-25 DIAGNOSIS — F17200 Nicotine dependence, unspecified, uncomplicated: Secondary | ICD-10-CM | POA: Diagnosis present

## 2015-03-25 DIAGNOSIS — R45851 Suicidal ideations: Secondary | ICD-10-CM | POA: Diagnosis present

## 2015-03-25 DIAGNOSIS — F4323 Adjustment disorder with mixed anxiety and depressed mood: Secondary | ICD-10-CM | POA: Diagnosis present

## 2015-03-25 DIAGNOSIS — F209 Schizophrenia, unspecified: Secondary | ICD-10-CM | POA: Diagnosis present

## 2015-03-25 MED ORDER — MAGNESIUM HYDROXIDE 400 MG/5ML PO SUSP
30.0000 mL | Freq: Every day | ORAL | Status: DC | PRN
Start: 2015-03-25 — End: 2015-04-04
  Administered 2015-03-29: 30 mL via ORAL
  Filled 2015-03-25: qty 30

## 2015-03-25 MED ORDER — ACETAMINOPHEN 325 MG PO TABS
650.0000 mg | ORAL_TABLET | Freq: Four times a day (QID) | ORAL | Status: DC | PRN
  Administered 2015-03-26 – 2015-04-01 (×4): 650 mg via ORAL
  Filled 2015-03-25 (×2): qty 2
  Filled 2015-03-25: qty 40
  Filled 2015-03-25 (×2): qty 2

## 2015-03-25 MED ORDER — HYDROXYZINE HCL 50 MG PO TABS
50.0000 mg | ORAL_TABLET | Freq: Three times a day (TID) | ORAL | Status: DC | PRN
  Administered 2015-03-27 – 2015-04-04 (×11): 50 mg via ORAL
  Filled 2015-03-25 (×11): qty 1
  Filled 2015-03-25: qty 30
  Filled 2015-03-25: qty 1

## 2015-03-25 MED ORDER — ACETAMINOPHEN 325 MG PO TABS: 650.0000 mg | ORAL_TABLET | Freq: Four times a day (QID) | ORAL | Status: DC | PRN

## 2015-03-25 MED ORDER — MAGNESIUM HYDROXIDE 400 MG/5ML PO SUSP: 30.0000 mL | Freq: Every day | ORAL | Status: DC | PRN

## 2015-03-25 MED ORDER — ALUM & MAG HYDROXIDE-SIMETH 200-200-20 MG/5ML PO SUSP: 30.0000 mL | ORAL | Status: DC | PRN

## 2015-03-25 MED ORDER — TRAZODONE HCL 50 MG PO TABS
50.0000 mg | ORAL_TABLET | Freq: Every evening | ORAL | Status: DC | PRN
  Administered 2015-03-27 – 2015-04-01 (×6): 50 mg via ORAL
  Filled 2015-03-25 (×3): qty 1
  Filled 2015-03-25 (×2): qty 14
  Filled 2015-03-25 (×3): qty 1

## 2015-03-25 NOTE — ED Notes (Signed)
Dr Schlossman in w/pt. 

## 2015-03-25 NOTE — ED Notes (Signed)
Meal tray ordered 
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
## 2015-03-25 NOTE — ED Notes (Signed)
Telepsych being performed by May, NP, [facility]. 
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
## 2015-03-25 NOTE — ED Notes (Signed)
Refreshments Provided (Ginger Ale)

## 2015-03-25 NOTE — Progress Notes (Signed)
Adult Psychoeducational Group Note  Date:  03/25/2015 Time:  10:35 PM  Group Topic/Focus:  Wrap-Up Group:   The focus of this group is to help patients review their daily goal of treatment and discuss progress on daily workbooks.  Participation Level:    Participation Quality:    Affect:    Cognitive:   Insight:   Engagement in Group:    Modes of Intervention:    Additional Comments:  Patient did not attend group.  Natasha Mead 03/25/2015, 10:35 PM

## 2015-03-25 NOTE — ED Notes (Signed)
Bianca called from [facility] and declined pt due to acuity.

## 2015-03-25 NOTE — ED Notes (Signed)
Refreshments Provided (2 x Water)

## 2015-03-26 ENCOUNTER — Encounter (HOSPITAL_COMMUNITY): Payer: Self-pay | Admitting: Psychiatry

## 2015-03-26 DIAGNOSIS — F4323 Adjustment disorder with mixed anxiety and depressed mood: Secondary | ICD-10-CM

## 2015-03-26 DIAGNOSIS — R443 Hallucinations, unspecified: Secondary | ICD-10-CM

## 2015-03-26 DIAGNOSIS — R45851 Suicidal ideations: Secondary | ICD-10-CM

## 2015-03-26 DIAGNOSIS — F4310 Post-traumatic stress disorder, unspecified: Secondary | ICD-10-CM

## 2015-03-26 MED ORDER — BACITRACIN-NEOMYCIN-POLYMYXIN OINTMENT TUBE
TOPICAL_OINTMENT | Freq: Three times a day (TID) | CUTANEOUS | Status: DC
  Administered 2015-03-26: 1 via TOPICAL
  Administered 2015-03-27: 09:00:00 via TOPICAL
  Administered 2015-03-27 (×2): 1 via TOPICAL
  Administered 2015-03-28 – 2015-04-03 (×8): via TOPICAL
  Filled 2015-03-26: qty 15
  Filled 2015-03-26: qty 1

## 2015-03-26 MED ORDER — BACITRACIN-NEOMYCIN-POLYMYXIN OINTMENT TUBE: TOPICAL_OINTMENT | Freq: Every day | CUTANEOUS | Status: DC

## 2015-03-26 MED ORDER — NICOTINE POLACRILEX 2 MG MT GUM
2.0000 mg | CHEWING_GUM | OROMUCOSAL | Status: DC | PRN
  Administered 2015-03-27 – 2015-04-04 (×13): 2 mg via ORAL
  Filled 2015-03-26 (×4): qty 1
  Filled 2015-03-26: qty 5
  Filled 2015-03-26 (×4): qty 1

## 2015-03-26 MED ORDER — IBUPROFEN 400 MG PO TABS
400.0000 mg | ORAL_TABLET | Freq: Three times a day (TID) | ORAL | Status: AC
  Administered 2015-03-26 – 2015-03-28 (×6): 400 mg via ORAL
  Filled 2015-03-26 (×6): qty 1

## 2015-03-26 MED ORDER — CITALOPRAM HYDROBROMIDE 20 MG PO TABS
20.0000 mg | ORAL_TABLET | Freq: Every day | ORAL | Status: DC
  Administered 2015-03-26 – 2015-04-04 (×10): 20 mg via ORAL
  Filled 2015-03-26 (×3): qty 14
  Filled 2015-03-26 (×6): qty 1
  Filled 2015-03-26 (×4): qty 14
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
## 2015-03-26 NOTE — [facility] Group Notes (Signed)
[facility] LCSW Group Therapy  03/26/2015 1:15pm  Type of Therapy:  Group Therapy vercoming Obstacles  Participation Level:  Minimal  Participation Quality:  Reserved  Affect:  Appropriate  Cognitive:  Appropriate and Oriented  Insight:  Developing/Improving and Improving  Engagement in Therapy:  Improving  Modes of Intervention:  Discussion, Exploration, Problem-solving and Support  Description of Group:   In this group patients will be encouraged to explore what they see as obstacles to their own wellness and recovery. They will be guided to discuss their thoughts, feelings, and behaviors related to these obstacles. The group will process together ways to cope with barriers, with attention given to specific choices patients can make. Each patient will be challenged to identify changes they are motivated to make in order to overcome their obstacles. This group will be process-oriented, with patients participating in exploration of their own experiences as well as giving and receiving support and challenge from other group members.  Summary of Patient Progress: Pt did not participate verbally in group discussion however remained attentive AEB by maintaining eye contact and non-verbal affirming gestures.   Therapeutic Modalities:   Cognitive Behavioral Therapy Solution Focused Therapy Motivational Interviewing Relapse Prevention Therapy   Chad Cordial, LCSWA 03/26/2015 4:29 PM

## 2015-03-26 NOTE — Progress Notes (Signed)
Recreation Therapy Notes  Date: 07.25.16 Time: 9:30 am Location: [address] Group Room  Group Topic: Stress Management  Goal Area(s) Addresses:  Patient will verbalize importance of using healthy stress management.  Patient will identify positive emotions associated with healthy stress management.   Intervention: Stress Management  Activity :  Progressive Muscle Relaxation.  LRT introduced and educated patients on stress management technique of progressive muscle relaxation.  A script was used to deliver the technique to patients.  Patients were asked to follow script read allowed by LRT to engage in practicing the stress management technique.  Education:  Stress Management, Discharge Planning.   Education Outcome: Acknowledges edcuation/In group clarification offered/Needs additional education  Clinical Observations/Feedback: Patient did not attend group.   Jaimen Melone, LRT/CTRS         Chapin Arduini A 03/26/2015 1:23 PM 
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
## 2015-03-26 NOTE — BHH Group Notes (Signed)
[facility] LCSW Aftercare Discharge Planning Group Note  03/26/2015 8:45 AM  Participation Quality: Alert, Appropriate and Oriented  Mood/Affect: Flat  Depression Rating: "middle"  Anxiety Rating: "middle"  Thoughts of Suicide: Pt denies SI/HI  Will you contract for safety? Yes  Current AVH: Pt denies  Plan for Discharge/Comments: Pt attended discharge planning group and actively participated in group. CSW discussed suicide prevention education with the group and encouraged them to discuss discharge planning and any relevant barriers. Pt was minimal during group discussion, however did report that she thinks she will be going to jail when discharged from the hospital.  Transportation Means: Pt reports access to transportation  Supports: No supports mentioned at this time  Chad Cordial, LCSWA 03/26/2015 1:17 PM

## 2015-03-26 NOTE — Progress Notes (Signed)
Patient seen on dayroom watching TV; wrapped her self with blanket. Can only see patient's eyes. Patient responded to greetings but could not engage in a conversation with this Clinical research associate. Patient can only respond by shaking and nodding her head. At one point, patient covered her face with the blanket and later stood up and walk to her room. Sleeping at this time. Every 15 minutes check for safety maintained. Will continue to monitor patient for safety and stability.
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
## 2015-03-26 NOTE — Plan of Care (Signed)
Problem: Diagnosis: Increased Risk For Suicide Attempt Goal: STG-Patient Will Report Suicidal Feelings to Staff Outcome: Progressing Patient denies thoughts of SI to writer during morning assessment.

## 2015-03-26 NOTE — Progress Notes (Signed)
B[facility] Group Notes:  (Nursing/MHT/Case Management/Adjunct)  Date:  03/26/2015  Time:  11:10 PM  Type of Therapy:  Psychoeducational Skills  Participation Level:  Minimal  Participation Quality:  Appropriate  Affect:  Appropriate  Cognitive:  Appropriate  Insight:  Appropriate  Engagement in Group:  Engaged  Modes of Intervention:  Discussion  Summary of Progress/Problems: Tonight in wrap up group Mauricia said that her day was a 6, it was better than yesterday. But what made her happy was being able to talk on the phone with her mother who lives near [place].  Madaline Savage 03/26/2015, 11:10 PM

## 2015-03-27 NOTE — Progress Notes (Signed)
Patient ID: Mariah Mccarthy, female   DOB: 21-Aug-1991, 24 y.o.   MRN: [id number]  DAR: Pt. Denies SI/HI and A/V Hallucinations to this writer during morning assessment. Scheduled medications administered to patient per physician's orders. Patient's right hand was re-bandaged after patient took a shower. Cuts on her hands look clean and bandage is now clean and intact. Patient is receiving scheduled ibuprofen when she reports, "I don't think it 's working." However, patient rated her pain lower on reassessment this afternoon. Support and encouragement provided to the patient.  Patient is receptive and cooperative but remains guarded and forwards very little. She is seen in the milieu at times and is attending some groups. Q15 minute checks are maintained for safety.
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
## 2015-03-27 NOTE — [facility] Group Notes (Signed)
[facility] LCSW Group Therapy 03/27/2015 1:15 PM  Type of Therapy: Group Therapy- Feelings about Diagnosis  Participation Level: Minimal  Participation Quality:  Reserved but Attentive  Affect:  Flat  Cognitive: Alert and Oriented   Insight:  Limited  Engagement in Therapy: Limited   Modes of Intervention: Clarification, Confrontation, Discussion, Education, Exploration, Limit-setting, Orientation, Problem-solving, Rapport Building, Dance movement psychotherapist, Socialization and Support  Description of Group:   This group will allow patients to explore their thoughts and feelings about diagnoses they have received. Patients will be guided to explore their level of understanding and acceptance of these diagnoses. Facilitator will encourage patients to process their thoughts and feelings about the reactions of others to their diagnosis, and will guide patients in identifying ways to discuss their diagnosis with significant others in their lives. This group will be process-oriented, with patients participating in exploration of their own experiences as well as giving and receiving support and challenge from other group members.    Therapeutic Modalities:   Cognitive Behavioral Therapy Solution Focused Therapy Motivational Interviewing Relapse Prevention Therapy  Chad Cordial, LCSWA 03/27/2015 5:08 PM

## 2015-03-27 NOTE — Progress Notes (Signed)
Recreation Therapy Notes  Animal-Assisted Activity (AAA) Program Checklist/Progress Notes Patient Eligibility Criteria Checklist & Daily Group note for Rec Tx Intervention  Date: 07.26.16 Time: 2:45 pm Location: [address] Dayroom  AAA/T Program Assumption of Risk Form signed by Patient/ or Parent Legal Guardian yes  Patient is free of allergies or sever asthma yes  Patient reports no fear of animals yes  Patient reports no history of cruelty to animalsyes  Patient understands his/her participation is voluntary yes  Patient washes hands before animal contact yes  Patient washes hands after animal contact yes  Behavioral Response: Attentive  Education: Hand Washing, Appropriate Animal Interaction   Education Outcome: Acknowledges understanding/In group clarification offered/Needs additional education.   Clinical Observations/Feedback:  Patient attended group.   Clarabell Matsuoka, LRT/CTRS         Aayden Cefalu A 03/27/2015 3:57 PM 

## 2015-03-27 NOTE — Progress Notes (Signed)
D. Pt was up and active in milieu this evening, did attend and participate in evening group activity. Pt spoke about her day and spoke about feeling better today. Pt appeared engaged with fellow peers in milieu, pt did not receive any medications for bed and did not verbalize any complaints. A. Support and encouragement provided. R. Safety maintained, will continue to monitor.

## 2015-03-27 NOTE — Progress Notes (Signed)
[facility] Group Notes:  (Nursing/MHT/Case Management/Adjunct)  Date:  03/27/2015  Time:  9:18 PM  Type of Therapy:  Psychoeducational Skills  Participation Level:  Minimal  Participation Quality:  Appropriate  Affect:  Appropriate  Cognitive:  Appropriate  Insight:  Appropriate  Engagement in Group:  Engaged  Modes of Intervention:  Discussion  Summary of Progress/Problems: Tonight in wrap up group Mariah Mccarthy said that her day was a 3 or 4. She was glad to be able to see her kids but nothing about her day was going too well for her.  Mariah Mccarthy 03/27/2015, 9:18 PM

## 2015-03-27 NOTE — [facility] Group Notes (Signed)
[facility] Group Notes:  (Nursing/MHT/Case Management/Adjunct)  Date:  03/27/2015  Time:  9:45 AM  Type of Therapy:  Nurse Education  Participation Level:  Minimal  Participation Quality:  Appropriate  Affect:  Blunted  Cognitive:  Alert  Insight:  Limited  Engagement in Group:  Limited  Modes of Intervention:  Discussion and Education  Summary of Progress/Problems: The purpose of this group is to discuss the topic of the day which is Recovery. Patients did a Recovery activity and patient's were encouraged to fill it out. Patient was seen looking at the activity sheet and filling it out however did not opt to report what she wrote when writer asked patient's to share if willing.   Marzetta Board E 03/27/2015, 9:45 AM

## 2015-03-27 NOTE — Progress Notes (Signed)
Pt attended spiritual care group on grief and loss facilitated by chaplain Burnis Kingfisher. Group opened with brief discussion and psycho-social ed around grief and loss in relationships and in relation to self - identifying life patterns, circumstances, changes that cause losses. Established group norm of speaking from own life experience. Group goal of establishing open and affirming space for members to share loss and experience with grief, normalize grief experience and provide psycho social education and grief support.  Group drew on narrative and Adlerian therapeutic modalities.   Adeola (VEE) was present throughout group.  She was attentive, but did not engage in group discussion.    Belva Crome MDiv

## 2015-03-27 NOTE — Plan of Care (Signed)
Problem: Diagnosis: Increased Risk For Suicide Attempt Goal: STG-Patient Will Comply With Medication Regime Outcome: Progressing Patient is compliant with medication regimen at this time.      

## 2015-03-28 NOTE — Progress Notes (Signed)
D. Pt had been up and visible in milieu this evening, did attend and participate in evening group activity. Pt has appeared flat and depressed in the milieu and has endorsed such feelings but did admit feeling better than when she came in. Pt did speak about some difficulties with sleep the night before and did request and receive medication to help. Pt has dressing on hand that is dry and intact. A. Support and encouragement provided. R. Safety maintained, will continue to monitor.
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
## 2015-03-28 NOTE — Plan of Care (Signed)
Problem: Diagnosis: Increased Risk For Suicide Attempt Goal: STG-Patient Will Attend All Groups On The Unit Outcome: Progressing Patient is attending some groups.

## 2015-03-28 NOTE — [facility] Group Notes (Signed)
[facility] LCSW Group Therapy 03/28/2015 1:15 PM  Type of Therapy: Group Therapy- Emotion Regulation  Participation Level: Active   Participation Quality:  Appropriate  Affect: Appropriate  Cognitive: Alert and Oriented   Insight:  Developing/Improving  Engagement in Therapy: Developing/Improving and Engaged   Modes of Intervention: Clarification, Confrontation, Discussion, Education, Exploration, Limit-setting, Orientation, Problem-solving, Rapport Building, Dance movement psychotherapist, Socialization and Support  Summary of Progress/Problems: The topic for group today was emotional regulation. This group focused on both positive and negative emotion identification and allowed group members to process ways to identify feelings, regulate negative emotions, and find healthy ways to manage internal/external emotions. Group members were asked to reflect on a time when their reaction to an emotion led to a negative outcome and explored how alternative responses using emotion regulation would have benefited them. Group members were also asked to discuss a time when emotion regulation was utilized when a negative emotion was experienced. Pt was more active in group discussion today and identified anger as an emotion she has difficulty regulating. Pt expressed that she often says things that she does not mean when she is angry. Pt was able to identify a coping skill of "taking a drive" when she gets angry.   Chad Cordial, LCSWA 03/28/2015 4:32 PM

## 2015-03-28 NOTE — Progress Notes (Signed)
Adult Psychoeducational Group Note  Date:  03/28/2015 Time:  9:50 PM  Group Topic/Focus:  Wrap-Up Group:   The focus of this group is to help patients review their daily goal of treatment and discuss progress on daily workbooks.  Participation Level:  Active  Participation Quality:  Appropriate  Affect:  Appropriate  Cognitive:  Appropriate  Insight: Appropriate  Engagement in Group:  Engaged  Modes of Intervention:  Discussion  Additional Comments:  The patient expressed she learned in group not to isolate from people.The patient also said that she also learned to keep positive people around you.  Octavio Manns 03/28/2015, 9:50 PM

## 2015-03-28 NOTE — BHH Group Notes (Signed)
[facility] LCSW Aftercare Discharge Planning Group Note  03/28/2015 8:45 AM  Participation Quality: Alert, Appropriate and Oriented  Mood/Affect: Flat   Depression Rating: 2  Anxiety Rating: 2  Thoughts of Suicide: Pt denies SI/HI  Will you contract for safety? Yes  Current AVH: Pt denies  Plan for Discharge/Comments: Pt attended discharge planning group and actively participated in group. CSW discussed suicide prevention education with the group and encouraged them to discuss discharge planning and any relevant barriers. Pt reports feeling tired but improved mood. There is a court order to inform law enforcement of when she is discharged. Pt plans to follow-up at [facility] in [place] if released from jail.  Transportation Means: Pt reports access to transportation  Supports: Mom  Chad Cordial, Theresia Majors 03/28/2015 11:04 AM

## 2015-03-28 NOTE — Progress Notes (Signed)
Patient ID: Mariah Mccarthy, female   DOB: 16-Aug-1991, 24 y.o.   MRN: [id number]  DAR: Pt. Denies SI/HI and A/V Hallucinations. Patient continues to report pain in right hand and received scheduled Ibuprofen. Patient's hand bandage was changed twice throughout the day. No drainage noted, just patient preference. Support and encouragement provided to the patient. Scheduled medications administered to patient per physician's orders. Patient is receptive and cooperative however remains minimal in interaction. She is seen in the milieu and is attending some groups. Q15 minute checks are maintained for safety.

## 2015-03-28 NOTE — Progress Notes (Signed)
Recreation Therapy Notes  Date: 07.27.16 Time: 9:30 am Location: 300 Hall Group Room  Group Topic: Stress Management  Goal Area(s) Addresses:  Patient will verbalize importance of using healthy stress management.  Patient will identify positive emotions associated with healthy stress management.   Intervention: Stress Management  Activity :  Guided Imagery Script.  LRT introduced the technique of guided imagery.  A script was used to deliver the technique to the patients.  Patients were asked to follow the script read a loud by LRT to engage in the technique of guided imagery.  Education:  Stress Management, Discharge Planning.   Education Outcome: Acknowledges edcuation/In group clarification offered/Needs additional education  Clinical Observations/Feedback: Patient did not attend group.   Khoa Opdahl, LRT/CTRS         Aniela Caniglia A 03/28/2015 12:24 PM 

## 2015-03-29 DIAGNOSIS — F332 Major depressive disorder, recurrent severe without psychotic features: Secondary | ICD-10-CM | POA: Diagnosis present

## 2015-03-29 MED ORDER — NICOTINE POLACRILEX 2 MG MT GUM: 2.0000 mg | CHEWING_GUM | OROMUCOSAL | Status: DC | PRN

## 2015-03-29 MED ORDER — TRAZODONE HCL 50 MG PO TABS: 50.0000 mg | ORAL_TABLET | Freq: Every evening | ORAL | Status: DC | PRN

## 2015-03-29 MED ORDER — HYDROXYZINE HCL 50 MG PO TABS: 50.0000 mg | ORAL_TABLET | Freq: Three times a day (TID) | ORAL | Status: DC | PRN

## 2015-03-29 MED ORDER — CITALOPRAM HYDROBROMIDE 20 MG PO TABS: 20.0000 mg | ORAL_TABLET | Freq: Every day | ORAL | Status: DC

## 2015-03-29 MED ORDER — ACETAMINOPHEN 500 MG PO TABS: 1000.0000 mg | ORAL_TABLET | Freq: Four times a day (QID) | ORAL | Status: DC | PRN
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
## 2015-03-29 NOTE — BHH Group Notes (Signed)
BHH Group Notes:  (Nursing/MHT/Case Management/Adjunct)  Date:  03/29/2015  Time:  0900 am  Type of Therapy:  Psychoeducational Skills  Participation Level:  Minimal  Participation Quality:  Appropriate  Affect:  Appropriate  Cognitive:  Alert and Appropriate  Insight:  Good  Engagement in Group:  Engaged  Modes of Intervention:  Support  Summary of Progress/Problems: Patient expressed her desire to stay another day.  She was supposed to be discharged today, but does not feel she is ready.  She states she is having mood swings.  Cranford Mon 03/29/2015, 9:39 AM

## 2015-03-29 NOTE — Progress Notes (Signed)
Attended Karaoke group tonight, and participated.  

## 2015-03-29 NOTE — BHH Suicide Risk Assessment (Signed)
B[facility] INPATIENT:  Family/Significant Other Suicide Prevention Education  Suicide Prevention Education:  Education Completed; Mariah Mccarthy, Pt's mother [phone], has been identified by the patient as the family member/significant other with whom the patient will be residing, and identified as the person(s) who will aid the patient in the event of a mental health crisis (suicidal ideations/suicide attempt).  With written consent from the patient, the family member/significant other has been provided the following suicide prevention education, prior to the and/or following the discharge of the patient.  The suicide prevention education provided includes the following:  Suicide risk factors  Suicide prevention and interventions  National Suicide Hotline telephone number  [facility] assessment telephone number  [facility] Emergency Assistance 911  [facility] and/or Residential Mobile Crisis Unit telephone number  Request made of family/significant other to:  Remove weapons (e.g., guns, rifles, knives), all items previously/currently identified as safety concern.    Remove drugs/medications (over-the-counter, prescriptions, illicit drugs), all items previously/currently identified as a safety concern.  The family member/significant other verbalizes understanding of the suicide prevention education information provided.  The family member/significant other agrees to remove the items of safety concern listed above.  Mariah Mccarthy 03/29/2015, 10:18 AM

## 2015-03-29 NOTE — BHH Group Notes (Signed)
[facility] Mental [employer] Association Group Therapy 03/29/2015 1:15pm  Type of Therapy: Mental Health Association Presentation  Participation Level: Active  Participation Quality: Attentive  Affect: Appropriate  Cognitive: Oriented  Insight: Developing/Improving  Engagement in Therapy: Engaged  Modes of Intervention: Discussion, Education and Socialization  Summary of Progress/Problems: Mental Health Association (MHA) Speaker came to talk about his personal journey with substance abuse and addiction. The pt processed ways by which to relate to the speaker. MHA speaker provided handouts and educational information pertaining to groups and services offered by the [facility]. Pt was engaged in speaker's presentation and was receptive to resources provided.    Chad Cordial, LCSWA 03/29/2015 1:38 PM

## 2015-03-30 MED ORDER — LAMOTRIGINE 25 MG PO TABS
25.0000 mg | ORAL_TABLET | Freq: Every day | ORAL | Status: DC
  Administered 2015-03-30 – 2015-04-02 (×4): 25 mg via ORAL
  Filled 2015-03-30 (×6): qty 1

## 2015-03-30 MED ORDER — ARIPIPRAZOLE 2 MG PO TABS
2.0000 mg | ORAL_TABLET | Freq: Every day | ORAL | Status: DC
  Administered 2015-03-30 – 2015-04-02 (×4): 2 mg via ORAL
  Filled 2015-03-30 (×6): qty 1

## 2015-03-30 NOTE — BHH Group Notes (Signed)
[facility] LCSW Aftercare Discharge Planning Group Note  03/30/2015 8:45 AM  Participation Quality: Alert, Appropriate and Oriented  Mood/Affect: Flat  Depression Rating: 4  Anxiety Rating: 4  Thoughts of Suicide: Pt denies SI/HI  Will you contract for safety? Yes  Current AVH: Pt denies  Plan for Discharge/Comments: Pt attended discharge planning group and actively participated in group. CSW discussed suicide prevention education with the group and encouraged them to discuss discharge planning and any relevant barriers. Pt reports feeling "okay" but is not able to identify specifics about her feelings.   Transportation Means: Pt reports access to transportation  Supports: No supports mentioned at this time  Chad Cordial, LCSWA 03/30/2015 11:40 AM

## 2015-03-30 NOTE — Progress Notes (Signed)
Recreation Therapy Notes  Date: 07.29.16 Time: 9:30 am Location: 300 Hall Group Room  Group Topic: Stress Management  Goal Area(s) Addresses:  Patient will verbalize importance of using healthy stress management.  Patient will identify positive emotions associated with healthy stress management.   Intervention: Stress Management  Activity :  Progressive Muscle Relaxation.  LRT introduced and educated patients on the technique of progressive muscle relaxation.  Mccarthy script was used to deliver the technique to the patients.  Patients were asked to follow the script read Mccarthy loud by the LRT to engage in practicing the stress management technique.  Education:  Stress Management, Discharge Planning.   Education Outcome: Acknowledges edcuation/In group clarification offered/Needs additional education  Clinical Observations/Feedback: Patient did not attend group.   Mariah Mccarthy, LRT/CTRS         Mariah Mccarthy 03/30/2015 3:11 PM 

## 2015-03-30 NOTE — Progress Notes (Signed)
Pt reports she is feeling anxious about what may happen when she is discharged from [facility].  She asked to stay another day, but will probably discharge to the police tomorrow because of the charges against her.  She has been a little more quiet and subdued this evening.  She did go to [employer] and MHT reported that she participated.  Pt denies SI/HI/AVH.  She is worried about not seeing her children.  She doesn't even know if her BF is alive.  Pt has been pleasant and appropriate on the unit.  She requested bandaids for her hand.  The wounds look good with no redness or signs of infection.  Support and encouragement offered.  Pt makes his needs known to staff.  Safety maintained with q15 minute checks.
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
## 2015-03-30 NOTE — BHH Group Notes (Signed)
[facility] LCSW Group Therapy 03/30/2015 1:15pm  Type of Therapy: Group Therapy- Feelings Around Relapse and Recovery  Participation Level: Active   Participation Quality:  Appropriate  Affect:  Appropriate  Cognitive: Alert and Oriented   Insight:  Developing   Engagement in Therapy: Developing/Improving and Engaged   Modes of Intervention: Clarification, Confrontation, Discussion, Education, Exploration, Limit-setting, Orientation, Problem-solving, Rapport Building, Dance movement psychotherapist, Socialization and Support  Summary of Progress/Problems: The topic for today was feelings about relapse. The group discussed what relapse prevention is to them and identified triggers that they are on the path to relapse. Members also processed their feeling towards relapse and were able to relate to common experiences. Group also discussed coping skills that can be used for relapse prevention.  Pt continues to engage more in actively in discussion and is able to talk openly about her depression. She expressed that her depression is overwhelming at times as she feels like medications do not help. Pt also described that she finds that her family does not understand her depression and feels that she cannot confide in them. Pt was receptive to feedback from peers and CSW regarding positive self talk and challenging negative thoughts.   Therapeutic Modalities:   Cognitive Behavioral Therapy Solution-Focused Therapy Assertiveness Training Relapse Prevention Therapy    Lamar Sprinkles [phone] 03/30/2015 3:20 PM
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
## 2015-03-30 NOTE — Progress Notes (Signed)
D: Patient is going to stay through the weekend per MD.  Patient still complains of severe mood swings, especially in the afternoon. Patient got into a verbal altercation with one of her peers stating, "you don't want to mess with me; I'm homicidal!"  She remains anxious over going to jail when discharged.  She denies SI/AVH.  She did make a HI remark to another patient.  She rates her depression, hopelessness and anxiety as a 4.  She is sleeping and eating well.  She feel she will be ready for discharge on Monday. A: Continue to monitor medication management and MD orders.  Safety checks completed every 15 minutes per protocol.  Offer support and encouragement when needed. R: Patient has been redirectable.

## 2015-03-31 DIAGNOSIS — F332 Major depressive disorder, recurrent severe without psychotic features: Principal | ICD-10-CM

## 2015-03-31 NOTE — [facility] Group Notes (Signed)
[facility] Group Notes:  (Nursing/MHT/Case Management/Adjunct)  Date:  03/31/2015  Time:  11:06 PM  Type of Therapy:  Psychoeducational Skills  Participation Level:  Active  Participation Quality:  Appropriate and Sharing  Affect:  Appropriate  Cognitive:  Alert and Oriented  Insight:  Limited  Engagement in Group:  Engaged  Modes of Intervention:  Clarification, Discussion and Support  Summary of Progress/Problems: Pt. Rates her day a 6 out of 10.  Reports her depression has decreased, states her Mother tries to be supportive  But pt. Does not feel the Mother understands her. Pt. States she needs more positive people in her life.  Cooper Render 03/31/2015, 11:06 PM
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
## 2015-03-31 NOTE — [facility] Group Notes (Signed)
[facility] LCSW Group Therapy  03/31/2015 1:15 PM  Type of Therapy:  Group Therapy  Participation Level:  Active  Participation Quality:  Attentive and Sharing  Affect:  Flat  Cognitive:  Appropriate  Insight:  Developing/Improving  Engagement in Therapy:  Developing/Improving  Modes of Intervention:  Discussion, Exploration, Rapport Building, Socialization and Support  Summary of Progress/Problems: The main focus of today's process group was to learn how to use a decisional balance exercise to move forward in the Stages of Change, which were described and discussed. Motivational Interviewing and the whiteboard were utilized to help patients explore in depth the perceived benefits and costs of unhealthy coping techniques, as well as the benefits and costs of replacing that with a healthy coping skills.Mariah Mccarthy shared her excitement about getting GED and regaining custody of her children this year during group warm up exercise. Patient acknowledged that she often p[uts herself last and over commits to other family and friends in need. Patient shared her  "high motivation to set boundaries with people who have negative influence."  Clide Dales 03/31/2015  3:42 PM

## 2015-03-31 NOTE — Progress Notes (Signed)
Pt has been out of her room and visible on the unit. Her mood seems a little brighter tonight.  She was able to visit with  her children tonight.  She denies SI/HI/AVH.  She says she will be able to stay in the hospital through the weekend.  She is still anxious about being discharged to police custody, but knows that it will happen eventually. Pt has been med compliant.  She makes her needs and concerns known to staff.  She has interacted with peers appropriately tonight.  Support and encouragement offered.  Safety maintained with q15 minute checks.

## 2015-03-31 NOTE — Progress Notes (Signed)
D.  Pt pleasant on approach, no complaints voiced.  Positive for evening wrap up group with appropriate participation.  Interacting appropriately with peers on unit.  Denies SI/HI/hallucinations at this time.  A.  Support and encouragement offered  R.  Pt remains safe on the unit, will continue to monitor.

## 2015-03-31 NOTE — Progress Notes (Signed)
D: Patient has been visible on the milieu.  She is interacting well with her peers.  She denies SI/HI/AVH.  She continues to express anxiety over going to jail.  Patient is possibly a discharge on Monday.  Her goal today is to "work on her mood."  She continues to complain of mood swings.  She presents with flat, anxious affect and depressed mood. A: Continue to monitor medication management and MD orders.  Safety checks completed every 15 minutes per protocol.  Offer support and encouragement as needed. R: Patient is receptive to staff.

## 2015-04-01 NOTE — Plan of Care (Signed)
Problem: Diagnosis: Increased Risk For Suicide Attempt Goal: LTG-Patient Will Report Improved Mood and Deny Suicidal LTG (by discharge) Patient will report improved mood and deny suicidal ideation.  Outcome: Progressing Patient reports mood is better and denies SI.

## 2015-04-01 NOTE — Progress Notes (Addendum)
Patient ID: Mariah Mccarthy, female   DOB: 1991/06/01, 24 y.o.   MRN: [id number]  Pt approached writer endorsing SI after "we came up from the gym." Pt states "We were all laughing downstairs and I started thinking about why I cannot be normal and why I think the way I do." Pt reports she has anxiety about going to jail once discharged from [facility]. Pt states "When I get out there I feel like I would just try to kill myself again."   Pt given a 1:1 and encouraged emotionally. Pt identifies her kids as motivation for her recovery. Pt asks "can I get something for anxiety." PRN vistaril administered, see MAR. Pt states after interaction, "I'm glad we talked, I will come and find someone if the thoughts start to get worse while I'm here."

## 2015-04-01 NOTE — [facility] Group Notes (Signed)
[facility] Group Notes:  (Nursing/MHT/Case Management/Adjunct)  Date:  04/01/2015  Time:  12:53 PM  Type of Therapy:  Nurse Education  Participation Level:  Minimal  Participation Quality:  Appropriate and Attentive  Affect:  Appropriate  Cognitive:  Alert and Appropriate  Insight:  Lacking  Engagement in Group:  Engaged  Modes of Intervention:  Discussion and Education  Summary of Progress/Problems: The purpose of this group is to discuss the topic of the day which is Healthy [employer]. Steps to making a change was also discussed. Mariah Mccarthy attended group and was engaged. She declined to speak during group.   Mariah Mccarthy E 04/01/2015, 12:53 PM

## 2015-04-01 NOTE — [facility] Group Notes (Signed)
[facility] Group Notes: (Clinical Social Work)   04/01/2015      Type of Therapy:  Group Therapy   Participation Level:  Did Not Attend despite MHT prompting   Ambrose Mantle, LCSW 04/01/2015, 3:04 PM
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
## 2015-04-01 NOTE — Progress Notes (Signed)
Patient ID: Mariah Mccarthy, female   DOB: 11-10-1990, 24 y.o.   MRN: [id number]  DAR: Pt. Denies SI/HI and A/V Hallucinations this morning during assessment however Silverio Decamp RN reported to Clinical research associate that after gym patient started to endorse SI. Writer spoke to patient when reassessing patient's PRN Vistaril and patient stated that she is able to contract for safety. She stated, "I am supposed to go tomorrow but I don't feel like I should." She reports that her sleep last night was good, appetite is good, energy level is normal, and concentration is between good and poor. She rates her depression 5/10, anxiety 6/10, and hopelessness 6/10. Patient had a headache this afternoon and received PRN Tylenol. Support and encouragement provided to the patient. Patient is receptive and cooperative. She is seen in the milieu interacting with her peers and is attending groups. Q15 minute checks are maintained for safety.

## 2015-04-02 MED ORDER — TRAZODONE HCL 100 MG PO TABS
100.0000 mg | ORAL_TABLET | Freq: Every evening | ORAL | Status: DC | PRN
  Administered 2015-04-02 – 2015-04-03 (×2): 100 mg via ORAL
  Filled 2015-04-02: qty 1
  Filled 2015-04-02: qty 14
  Filled 2015-04-02: qty 1

## 2015-04-02 MED ORDER — LAMOTRIGINE 25 MG PO TABS
25.0000 mg | ORAL_TABLET | Freq: Two times a day (BID) | ORAL | Status: DC
  Administered 2015-04-02 – 2015-04-04 (×4): 25 mg via ORAL
  Filled 2015-04-02 (×3): qty 1
  Filled 2015-04-02: qty 28
  Filled 2015-04-02 (×2): qty 1
  Filled 2015-04-02: qty 28
  Filled 2015-04-02 (×2): qty 1

## 2015-04-02 MED ORDER — ARIPIPRAZOLE 2 MG PO TABS
2.0000 mg | ORAL_TABLET | Freq: Two times a day (BID) | ORAL | Status: DC
  Administered 2015-04-02 – 2015-04-04 (×4): 2 mg via ORAL
  Filled 2015-04-02: qty 1
  Filled 2015-04-02 (×2): qty 28
  Filled 2015-04-02 (×5): qty 1

## 2015-04-02 MED ORDER — ASPIRIN-ACETAMINOPHEN-CAFFEINE 250-250-65 MG PO TABS
1.0000 | ORAL_TABLET | Freq: Three times a day (TID) | ORAL | Status: DC | PRN
  Administered 2015-04-02: 1 via ORAL
  Filled 2015-04-02: qty 1

## 2015-04-02 NOTE — Progress Notes (Signed)
Patient ID: Mariah Mccarthy, female   DOB: 13-Jun-1991, 23 y.o.   MRN: [id number] D: Patient rates depression, hopelessness, and anxiety level at 10.  Patient report poor sleep last night, sleep medication was not effective.  Admitted to having SI/HI but did not elaborate.  Agreed to Engineer, manufacturing systems.  Patient complain of headache.  States Tylenol is not effective and requested Excedrin medication.  Maintained on 15 minutes checks for safety per protocol. A: Patient received Vistaril for anxiety and Excedrin for headache. Offered support and encouragement. R: Patient seen attending milieu and socializing with peers.  No distress noted.

## 2015-04-02 NOTE — Progress Notes (Signed)
Recreation Therapy Notes  Date: 08.01.16 Time: 9:30 am Location: [address] Group Room  Group Topic: Stress Management  Goal Area(s) Addresses:  Patient will verbalize importance of using healthy stress management.  Patient will identify positive emotions associated with healthy stress management.   Intervention: Stress Management  Activity :  Guided Imagery Script.  LRT introduced and educated patients on the stress management technique of guided imagery script.  A script was read and patients were asked to follow a long as the LRT read the script a loud to engaged in the technique of guided imagery.  Education:  Stress Management, Discharge Planning.   Education Outcome: Acknowledges edcuation/In group clarification offered/Needs additional education  Clinical Observations/Feedback: Patient did not attend group.   Tamura Lasky, LRT/CTRS         Davaun Quintela A 04/02/2015 1:20 PM 
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
## 2015-04-02 NOTE — Progress Notes (Signed)
D: Patient alert and oriented x 4. Patient denies pain, HI and Visual hallucinations. Patient stated she is having audible hallucinations,  And Passive SI, no plan. Patient was able to contract verbally for safety with this nurse. Patient requested PRN medications for anxiety and to help her sleep. PRN Vistaril and PRN trazodone given at 2206, with effective results. Patient is currently resting with eyes closed in bed.  A: Staff to monitor Q 15 mins for safety. Encouragement and support offered. Scheduled medications administered per orders. R: Patient remains safe on the unit. Patient attended group tonight. Patient visible on hte unit and interacting with peers. Patient taking administered medications.

## 2015-04-02 NOTE — [facility] Group Notes (Signed)
[facility] LCSW Group Therapy 04/02/2015  1:15 PM   Type of Therapy: Group Therapy  Participation Level: Did Not Attend. Patient invited to participate but declined.   Amrie Gurganus, MSW, LCSWA Clinical Social Worker [facility] [phone]    

## 2015-04-02 NOTE — Plan of Care (Signed)
Problem: Alteration in mood & ability to function due to Goal: LTG-Pt reports reduction in suicidal thoughts (Patient reports reduction in suicidal thoughts and is able to verbalize a safety plan for whenever patient is feeling suicidal)  Outcome: Not Progressing Pt passive SI-contracts for safety Goal: STG-Patient will attend groups Outcome: Progressing Pt attended group this evening  Problem: Alteration in mood Goal: LTG-Patient reports reduction in suicidal thoughts (Patient reports reduction in suicidal thoughts and is able to verbalize a safety plan for whenever patient is feeling suicidal)  Outcome: Not Progressing Pt passive SI-contracts for safety  Problem: Ineffective individual coping Goal: LTG: Patient will report a decrease in negative feelings Outcome: Not Progressing Pt stated her feelings were fluctuating back and forth.

## 2015-04-02 NOTE — Progress Notes (Signed)
[facility] Group Notes:  (Nursing/MHT/Case Management/Adjunct)  Date:  04/02/2015  Time:  9:53 PM  Type of Therapy:  Psychoeducational Skills  Participation Level:  None  Participation Quality:  Resistant  Affect:  Irritable  Cognitive:  Appropriate  Insight:  Limited  Engagement in Group:  Limited  Modes of Intervention:  Discussion  Summary of Progress/Problems: Tonight in wrap up group Cambryn did not say much just that her day was a 1.  Madaline Savage 04/02/2015, 9:53 PM

## 2015-04-02 NOTE — Progress Notes (Signed)
D:  Pt Passive SI-contracts for safety. Pt denies HI/AVH. Pt is pleasant and cooperative. Pt denied AVH, but stated "I can't stop seeing and hearing things " . Pt observed on unit and her interaction was not presenting like she was having internal stimuli. Pt has no insight into her Tx and blames others for a lot of things in her life. Pt has very negative thinking and is not truthful with this Clinical research associate. Pt does not forward information.   A: Pt was offered support and encouragement. Pt was given scheduled medications. Pt was encourage to attend groups. Q 15 minute checks were done for safety.   R:Pt attends groups and interacts well with peers and staff. Pt is taking medication. Pt receptive to treatment and safety maintained on unit.
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
## 2015-04-03 NOTE — [facility] Group Notes (Signed)
Adult Psychoeducational Group Note  Date:  04/03/2015 Time:  0900am  Group Topic/Focus:  Goals Group:   The focus of this group is to help patients establish daily goals to achieve during treatment and discuss how the patient can incorporate goal setting into their daily lives to aide in recovery. Orientation:   The focus of this group is to educate the patient on the purpose and policies of crisis stabilization and provide a format to answer questions about their admission.  The group details unit policies and expectations of patients while admitted.  Participation Level:  Active  Participation Quality:  Appropriate and Attentive  Affect:  Flat  Cognitive:  Alert and Appropriate  Insight: Appropriate and Good  Engagement in Group:  Engaged  Modes of Intervention:  Discussion, Education, Orientation and Support  Lauris Poag 04/03/2015, 9:18 AM

## 2015-04-03 NOTE — Progress Notes (Signed)
Recreation Therapy Notes  Animal-Assisted Activity (AAA) Program Checklist/Progress Notes Patient Eligibility Criteria Checklist & Daily Group note for Rec Tx Intervention  Date: 08.02.16 Time: 02:45 pm Location: [address]  AAA/T Program Assumption of Risk Form signed by Patient/ or Parent Legal Guardian yes  Patient is free of allergies or sever asthma yes  Patient reports no fear of animals yes  Patient reports no history of cruelty to animals yes  Patient understands his/her participation is voluntary yes  Patient washes hands before animal contact yes  Patient washes hands after animal contact yes  Behavioral Response:  Engaged  Education: Charity fundraiser, Appropriate Animal Interaction   Education Outcome: Acknowledges understanding/In group clarification offered/Needs additional education.   Clinical Observations/Feedback:  Patient attended group.   Caroll Rancher, LRT/CTRS         Caroll Rancher A 04/03/2015 3:58 PM

## 2015-04-03 NOTE — Progress Notes (Signed)
D:  Per pt self inventory pt reports sleeping good, appetite good, energy level hyper, ability to pay attention good, rates depression at a 6 out of 10, hopelessness at a 5 out of 10, anxiety at an 8 out of 10, bright during interaction, flat affect, goal today: "temper, be positive", endorses some SI thoughts on and off, contracts for safety.   A:  Emotional support provided, Encouraged pt to continue with treatment plan and attend all group activities, q15 min checks maintained for safety.  R:  Pt is receptive, stated that she would "love to move in here", pt states that she does not want to discharge, going to groups, participates appropriately, pleasant and cooperative with staff and other patients on the unit.

## 2015-04-03 NOTE — [facility] Group Notes (Signed)
[facility] LCSW Group Therapy  04/03/2015   1:15 PM   Type of Therapy:  Group Therapy  Participation Level:  Active  Participation Quality:  Attentive, Sharing and Supportive  Affect:  Appropriate  Cognitive:  Alert and Oriented  Insight:  Developing/Improving and Engaged  Engagement in Therapy:  Developing/Improving and Engaged  Modes of Intervention:  Clarification, Confrontation, Discussion, Education, Exploration, Limit-setting, Orientation, Problem-solving, Rapport Building, Dance movement psychotherapist, Socialization and Support  Summary of Progress/Problems: The topic for group therapy was feelings about diagnosis.  Pt actively participated in group discussion on their past and current diagnosis and how they feel towards this.  Pt also identified how society and family members judge them, based on their diagnosis as well as stereotypes and stigmas.  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 She shared that she feels that her mother and brother (primary supports) are not emotionally supportive or understanding of her mental illness. Patient shared that her children are her motivation for doing better. CSW and other group members provided emotional support and encouragement.  Samuella Bruin, MSW, [employer] Clinical Social Worker [facility] [phone]

## 2015-04-03 NOTE — Progress Notes (Signed)
[facility] Group Notes:  (Nursing/MHT/Case Management/Adjunct)  Date:  04/03/2015  Time:  9:01 PM  Type of Therapy:  Psychoeducational Skills  Participation Level:  Active  Participation Quality:  Appropriate  Affect:  Appropriate  Cognitive:  Appropriate  Insight:  Good  Engagement in Group:  Engaged  Modes of Intervention:  Discussion  Summary of Progress/Problems: Tonight in wrap up group Mariah Mccarthy said that her day was a 6 shes nervous but ready for her discharge tomorrow. Shes glad to have the support from her hall mates.  Mariah Mccarthy 04/03/2015, 9:01 PM

## 2015-04-04 ENCOUNTER — Encounter (HOSPITAL_COMMUNITY): Payer: Self-pay | Admitting: Registered Nurse

## 2015-04-04 MED ORDER — HYDROXYZINE HCL 50 MG PO TABS: 50.0000 mg | ORAL_TABLET | Freq: Three times a day (TID) | ORAL | Status: DC | PRN

## 2015-04-04 MED ORDER — ACETAMINOPHEN 500 MG PO TABS
1000.0000 mg | ORAL_TABLET | Freq: Four times a day (QID) | ORAL | Status: DC | PRN
  Filled 2015-04-04: qty 30

## 2015-04-04 MED ORDER — LAMOTRIGINE 25 MG PO TABS: 25.0000 mg | ORAL_TABLET | Freq: Two times a day (BID) | ORAL | Status: DC

## 2015-04-04 MED ORDER — TRAZODONE HCL 100 MG PO TABS: 100.0000 mg | ORAL_TABLET | Freq: Every evening | ORAL | Status: DC | PRN

## 2015-04-04 MED ORDER — ARIPIPRAZOLE 2 MG PO TABS: 2.0000 mg | ORAL_TABLET | Freq: Two times a day (BID) | ORAL | Status: DC

## 2015-04-04 NOTE — Progress Notes (Signed)
Recreation Therapy Notes  Date: 08.03.16 Time: 930 am Location: [address]  Group Topic: Stress Management  Goal Area(s) Addresses:  Patient will verbalize importance of using healthy stress management.  Patient will identify positive emotions associated with healthy stress management.   Intervention: Stress Management  Activity : Guided Imagery Script. LRT will introduce and instruct patients on the stress management technique of guided imagery. Patients were asked to follow a long with a script read a loud by LRT to participate in the stress management technique of guided imagery.  Education: Stress Management, Discharge Planning.   Education Outcome: Acknowledges edcuation/In group clarification offered/Needs additional education  Clinical Observations/Feedback: Patient did not attend group.   Kerrigan Glendening, LRT/CTRS        Ashtian Villacis A 04/04/2015 2:41 PM 

## 2015-04-04 NOTE — [facility] Group Notes (Signed)
   [facility] LCSW Aftercare Discharge Planning Group Note  04/04/2015  8:45 AM   Participation Quality: Alert, Appropriate and Oriented  Mood/Affect: Appropriate  Depression Rating: 3  Anxiety Rating: 2  Thoughts of Suicide: Pt denies SI/HI  Will you contract for safety? Yes  Current AVH: Pt denies  Plan for Discharge/Comments: Pt attended discharge planning group and actively participated in group. CSW provided pt with today's workbook. Patient plans to leave with police at discharge and is agreeable for her mental health records to be sent to the jail so that she can continue her medications.  Transportation Means: Pt reports access to transportation  Supports: No supports mentioned at this time  Samuella Bruin, MSW, [employer] Clinical Social Worker [employer] [phone]
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
## 2015-04-04 NOTE — Clinical Social Work Note (Signed)
CSW received call from [facility] watch commander Lt. Luiz Blare [phone] who reports that patient has an open warrant and states that police will be at hospital at time of discharge to pick her up. CSW informed security, RN, and MD.  Samuella Bruin, MSW, LCSWA Clinical Social Worker [facility] [phone]

## 2015-04-04 NOTE — Progress Notes (Signed)
  [facility] Adult Case Management Discharge Plan :  Will you be returning to the same living situation after discharge:  No. Patient plans to discharge into police custody At discharge, do you have transportation home?: Yes, patient plans for police to provide transport Do you have the ability to pay for your medications: Yes,  patient will be discharged with prescriptions  Release of information consent forms completed and in the chart;  Patient's signature needed at discharge.  Patient to Follow up at: Follow-up Information    Follow up with Records will be sent to [facility] Records Dept., please ask to see a psychiatrist if admitted. If returning home, please follow up with [facility]- [address] [place] .      Patient denies SI/HI: Yes, denies  Aeronautical engineer and Suicide Prevention discussed: Yes,  with patient and mother  Have you used any form of tobacco in the last 30 days? (Cigarettes, Smokeless Tobacco, Cigars, and/or Pipes): Yes  Has patient been referred to the Quitline?: Patient refused referral  Nickie Warwick, [place] 04/04/2015, 11:04 AM

## 2015-04-04 NOTE — Progress Notes (Signed)
Discharge note: Pt received both written and verbal discharge instructions. Pt verbalized understanding of discharge instructions. Pt agreed to f/u appt and med regimen. Pt denied suicidal thoughts at time of discharge. Pt received sample meds and prescriptions. Pt received belongings from room and locker. Pt assessed by Dub Mikes, MD and [employer] was notified by CSW. Pt escorted via [employer] to [employer]. police dept. Pt safely left [facility] at time of discharge.

## 2015-04-04 NOTE — Clinical Social Work Note (Signed)
CSW consulted with [facility] Thurman Coyer regarding need to contact law enforcement at patient's discharge and was informed that it is required if court ordered. CSW spoke with [facility] Watch Erven Colla [phone] as requested by court order on chart. CSW informed watch commander that patient will discharge around 3 pm today. Watch commander agreeable to check patient's legal status (open warrants, etc.) and contact CSW back regarding if patient needs to be released to police.   Samuella Bruin, MSW, [employer] Clinical Social Worker [facility] [phone]
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
## 2015-04-04 NOTE — Progress Notes (Signed)
D: patient alert and oriented x 4. Patient denies SI/HI/AVH and denies pain. Patient was in day room interacting with other patients. Patient requested PRN dose of trazodone. PRN trazodone was given at 2240.  A: Staff to monitor Q 15 mins for safety. Encouragement and support offered. Scheduled medications administered per orders. R: Patient remains safe on the unit. Patient attended group tonight. Patient visible on hte unit and interacting with peers. Patient taking administered medications.

## 2015-04-13 ENCOUNTER — Emergency Department (HOSPITAL_COMMUNITY)
Admission: EM | Admit: 2015-04-13 | Discharge: 2015-04-14 | Disposition: A | Discharge status: Home / Self Care | Payer: Medicaid Other | Attending: Emergency Medicine | Admitting: Emergency Medicine

## 2015-04-13 ENCOUNTER — Emergency Department (HOSPITAL_COMMUNITY): Payer: Medicaid Other

## 2015-04-13 ENCOUNTER — Encounter (HOSPITAL_COMMUNITY): Payer: Self-pay | Admitting: Emergency Medicine

## 2015-04-13 DIAGNOSIS — Y9289 Other specified places as the place of occurrence of the external cause: Secondary | ICD-10-CM | POA: Diagnosis not present

## 2015-04-13 DIAGNOSIS — R52 Pain, unspecified: Secondary | ICD-10-CM

## 2015-04-13 DIAGNOSIS — F332 Major depressive disorder, recurrent severe without psychotic features: Secondary | ICD-10-CM | POA: Insufficient documentation

## 2015-04-13 DIAGNOSIS — Z720 Tobacco use: Secondary | ICD-10-CM | POA: Insufficient documentation

## 2015-04-13 DIAGNOSIS — W228XXA Striking against or struck by other objects, initial encounter: Secondary | ICD-10-CM | POA: Insufficient documentation

## 2015-04-13 DIAGNOSIS — Y9389 Activity, other specified: Secondary | ICD-10-CM | POA: Insufficient documentation

## 2015-04-13 DIAGNOSIS — G40909 Epilepsy, unspecified, not intractable, without status epilepticus: Secondary | ICD-10-CM | POA: Diagnosis not present

## 2015-04-13 DIAGNOSIS — S0990XA Unspecified injury of head, initial encounter: Secondary | ICD-10-CM | POA: Diagnosis present

## 2015-04-13 DIAGNOSIS — Z880 Allergy status to penicillin: Secondary | ICD-10-CM | POA: Insufficient documentation

## 2015-04-13 DIAGNOSIS — Y998 Other external cause status: Secondary | ICD-10-CM | POA: Diagnosis not present

## 2015-04-13 DIAGNOSIS — Z79899 Other long term (current) drug therapy: Secondary | ICD-10-CM | POA: Insufficient documentation

## 2015-04-13 LAB — I-STAT CHEM 8, ED
BUN: 5 mg/dL — AB (ref 6–20)
CHLORIDE: 100 mmol/L — AB (ref 101–111)
Calcium, Ion: 1.16 mmol/L (ref 1.12–1.23)
Creatinine, Ser: 0.8 mg/dL (ref 0.44–1.00)
Glucose, Bld: 101 mg/dL — ABNORMAL HIGH (ref 65–99)
HCT: 43 % (ref 36.0–46.0)
HEMOGLOBIN: 14.6 g/dL (ref 12.0–15.0)
Potassium: 3.9 mmol/L (ref 3.5–5.1)
SODIUM: 139 mmol/L (ref 135–145)
TCO2: 24 mmol/L (ref 0–100)

## 2015-04-13 MED ORDER — ALUM & MAG HYDROXIDE-SIMETH 200-200-20 MG/5ML PO SUSP: 30.0000 mL | ORAL | Status: DC | PRN

## 2015-04-13 MED ORDER — ARIPIPRAZOLE 2 MG PO TABS
2.0000 mg | ORAL_TABLET | Freq: Two times a day (BID) | ORAL | Status: DC
  Administered 2015-04-14: 2 mg via ORAL
  Filled 2015-04-13: qty 1

## 2015-04-13 MED ORDER — TRAZODONE HCL 100 MG PO TABS: 100.0000 mg | ORAL_TABLET | Freq: Every evening | ORAL | Status: DC | PRN

## 2015-04-13 MED ORDER — IBUPROFEN 400 MG PO TABS
600.0000 mg | ORAL_TABLET | Freq: Three times a day (TID) | ORAL | Status: DC | PRN
  Administered 2015-04-14: 600 mg via ORAL
  Filled 2015-04-13 (×2): qty 1

## 2015-04-13 MED ORDER — LORAZEPAM 1 MG PO TABS: 1.0000 mg | ORAL_TABLET | Freq: Three times a day (TID) | ORAL | Status: DC | PRN

## 2015-04-13 MED ORDER — LAMOTRIGINE 25 MG PO TABS
25.0000 mg | ORAL_TABLET | Freq: Two times a day (BID) | ORAL | Status: DC
  Filled 2015-04-13: qty 1

## 2015-04-13 MED ORDER — ZOLPIDEM TARTRATE 5 MG PO TABS
5.0000 mg | ORAL_TABLET | Freq: Every evening | ORAL | Status: DC | PRN
Start: 2015-04-13 — End: 2015-04-14

## 2015-04-13 MED ORDER — NICOTINE POLACRILEX 2 MG MT GUM
2.0000 mg | CHEWING_GUM | OROMUCOSAL | Status: DC | PRN
  Filled 2015-04-13: qty 1

## 2015-04-13 MED ORDER — CITALOPRAM HYDROBROMIDE 10 MG PO TABS: 20.0000 mg | ORAL_TABLET | Freq: Every day | ORAL | Status: DC
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
## 2015-04-13 NOTE — ED Notes (Signed)
Patient transported to CT scan . 

## 2015-04-13 NOTE — ED Notes (Signed)
Staffing office notified for pt.'s sitter . 

## 2015-04-13 NOTE — ED Notes (Signed)
Sherriff bedside with pt.

## 2015-04-13 NOTE — ED Notes (Signed)
Patient speaking with TTS.  
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
## 2015-04-14 MED ORDER — ONDANSETRON 4 MG PO TBDP
ORAL_TABLET | ORAL | Status: AC
  Filled 2015-04-14: qty 2

## 2015-04-14 MED ORDER — ONDANSETRON 4 MG PO TBDP
8.0000 mg | ORAL_TABLET | Freq: Once | ORAL | Status: AC
  Administered 2015-04-14: 8 mg via ORAL

## 2015-04-14 NOTE — ED Notes (Signed)
Pt. will be referred to [facility] psychiatric unit per TTS counselor.

## 2015-04-14 NOTE — Discharge Instructions (Signed)
You need to be seen by the mental health professionals at the jail to determine whether they can take care of your depresion, or whether you need t be transferred to [facility].  Depression Depression refers to feeling sad, low, down in the dumps, blue, gloomy, or empty. In general, there are two kinds of depression: 1. Normal sadness or normal grief. This kind of depression is one that we all feel from time to time after upsetting life experiences, such as the loss of a job or the ending of a relationship. This kind of depression is considered normal, is short lived, and resolves within a few days to 2 weeks. Depression experienced after the loss of a loved one (bereavement) often lasts longer than 2 weeks but normally gets better with time. 2. Clinical depression. This kind of depression lasts longer than normal sadness or normal grief or interferes with your ability to function at home, at work, and in school. It also interferes with your personal relationships. It affects almost every aspect of your life. Clinical depression is an illness. Symptoms of depression can also be caused by conditions other than those mentioned above, such as:  Physical illness. Some physical illnesses, including underactive thyroid gland (hypothyroidism), severe anemia, specific types of cancer, diabetes, uncontrolled seizures, heart and lung problems, strokes, and chronic pain are commonly associated with symptoms of depression.  Side effects of some prescription medicine. In some people, certain types of medicine can cause symptoms of depression.  Substance abuse. Abuse of alcohol and illicit drugs can cause symptoms of depression. SYMPTOMS Symptoms of normal sadness and normal grief include the following:  Feeling sad or crying for short periods of time.  Not caring about anything (apathy).  Difficulty sleeping or sleeping too much.  No longer able to enjoy the things you used to  enjoy.  Desire to be by oneself all the time (social isolation).  Lack of energy or motivation.  Difficulty concentrating or remembering.  Change in appetite or weight.  Restlessness or agitation. Symptoms of clinical depression include the same symptoms of normal sadness or normal grief and also the following symptoms:  Feeling sad or crying all the time.  Feelings of guilt or worthlessness.  Feelings of hopelessness or helplessness.  Thoughts of suicide or the desire to harm yourself (suicidal ideation).  Loss of touch with reality (psychotic symptoms). Seeing or hearing things that are not real (hallucinations) or having false beliefs about your life or the people around you (delusions and paranoia). DIAGNOSIS  The diagnosis of clinical depression is usually based on how bad the symptoms are and how long they have lasted. Your health care provider will also ask you questions about your medical history and substance use to find out if physical illness, use of prescription medicine, or substance abuse is causing your depression. Your health care provider may also order blood tests. TREATMENT  Often, normal sadness and normal grief do not require treatment. However, sometimes antidepressant medicine is given for bereavement to ease the depressive symptoms until they resolve. The treatment for clinical depression depends on how bad the symptoms are but often includes antidepressant medicine, counseling with a mental health professional, or both. Your health care provider will help to determine what treatment is best for you. Depression caused by physical illness usually goes away with appropriate medical treatment of the illness. If prescription medicine is causing depression, talk with your health care provider about stopping the medicine, decreasing the dose, or changing to another  medicine. Depression caused by the abuse of alcohol or illicit drugs goes away when you stop using these  substances. Some adults need professional help in order to stop drinking or using drugs. SEEK IMMEDIATE MEDICAL CARE IF:  You have thoughts about hurting yourself or others.  You lose touch with reality (have psychotic symptoms).  You are taking medicine for depression and have a serious side effect. FOR MORE INFORMATION  National Alliance on Mental Illness: www.nami.[employer] of Mental Health: http://www.maynard.net/ Document Released: 08/15/2000 Document Revised: 01/02/2014 Document Reviewed: 11/17/2011 [facility] Patient Information 2015 [place], [place]. This information is not intended to replace advice given to you by your health care provider. Make sure you discuss any questions you have with your health care provider.   Musculoskeletal Pain Musculoskeletal pain is muscle and boney aches and pains. These pains can occur in any part of the body. Your caregiver may treat you without knowing the cause of the pain. They may treat you if blood or urine tests, X-rays, and other tests were normal.  CAUSES There is often not a definite cause or reason for these pains. These pains may be caused by a type of germ (virus). The discomfort may also come from overuse. Overuse includes working out too hard when your body is not fit. Boney aches also come from weather changes. Bone is sensitive to atmospheric pressure changes. HOME CARE INSTRUCTIONS  3. Ask when your test results will be ready. Make sure you get your test results. 4. Only take over-the-counter or prescription medicines for pain, discomfort, or fever as directed by your caregiver. If you were given medications for your condition, do not drive, operate machinery or power tools, or sign legal documents for 24 hours. Do not drink alcohol. Do not take sleeping pills or other medications that may interfere with treatment. 5. Continue all activities unless the activities cause more pain. When the pain lessens, slowly resume normal  activities. Gradually increase the intensity and duration of the activities or exercise. 6. During periods of severe pain, bed rest may be helpful. Lay or sit in any position that is comfortable. 7. Putting ice on the injured area. 1. Put ice in a bag. 2. Place a towel between your skin and the bag. 3. Leave the ice on for 15 to 20 minutes, 3 to 4 times a day. 8. Follow up with your caregiver for continued problems and no reason can be found for the pain. If the pain becomes worse or does not go away, it may be necessary to repeat tests or do additional testing. Your caregiver may need to look further for a possible cause. SEEK IMMEDIATE MEDICAL CARE IF:  You have pain that is getting worse and is not relieved by medications.  You develop chest pain that is associated with shortness or breath, sweating, feeling sick to your stomach (nauseous), or throw up (vomit).  Your pain becomes localized to the abdomen.  You develop any new symptoms that seem different or that concern you. MAKE SURE YOU:   Understand these instructions.  Will watch your condition.  Will get help right away if you are not doing well or get worse. Document Released: 08/18/2005 Document Revised: 11/10/2011 Document Reviewed: 04/22/2013 [facility] Patient Information 2015 [place], [place]. This information is not intended to replace advice given to you by your health care provider. Make sure you discuss any questions you have with your health care provider.

## 2015-04-14 NOTE — BH Assessment (Signed)
Initiated [facility] referral.   Clista Bernhardt, [facility] Triage Specialist 04/14/2015 2:06 AM
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
## 2015-04-14 NOTE — ED Notes (Signed)
Pt. refused to take Ibuprofen at time of administration .

## 2015-04-14 NOTE — ED Notes (Signed)
TTS counselor advised nurse that pt. meets admission criteria and will update [place] if bed is available .

## 2015-04-14 NOTE — ED Provider Notes (Signed)
Patient has been evaluated by behavioral health and she does meet inpatient criteria. Initially, work was started to get her transferred to [facility]. However, police were with her states that they do have mental health professionals who are able to provide care at the jail. I spoke with Sgt. Kathreen Cosier, who has stated that that patient is under suicide watch and I do have mental health nurse who can come and see her in the morning and can discuss case with psychiatrist to comes to the jail several times a week. He also states that they can arrange [facility]. Since it seems that she would be obtaining adequate care at jail, decision is made to discharge her. I was assured that she would be seen by the mental health person today and case be discussed with the psychiatrist.  Dione Booze, MD 04/14/15 [phone]

## 2015-05-30 ENCOUNTER — Inpatient Hospital Stay (HOSPITAL_COMMUNITY)
Admission: AD | Admit: 2015-05-30 | Discharge: 2015-05-30 | Disposition: A | Discharge status: Home / Self Care | Payer: Medicaid Other | Source: Ambulatory Visit | Attending: Obstetrics & Gynecology | Admitting: Obstetrics & Gynecology

## 2015-05-30 ENCOUNTER — Inpatient Hospital Stay (HOSPITAL_COMMUNITY): Payer: Medicaid Other

## 2015-05-30 ENCOUNTER — Emergency Department (HOSPITAL_COMMUNITY)
Admission: EM | Admit: 2015-05-30 | Discharge: 2015-05-30 | Discharge status: Absent without leave | Payer: Medicaid Other | Source: Home / Self Care

## 2015-05-30 ENCOUNTER — Encounter (HOSPITAL_COMMUNITY): Payer: Self-pay | Admitting: Student

## 2015-05-30 DIAGNOSIS — R109 Unspecified abdominal pain: Secondary | ICD-10-CM | POA: Insufficient documentation

## 2015-05-30 DIAGNOSIS — O26891 Other specified pregnancy related conditions, first trimester: Secondary | ICD-10-CM | POA: Insufficient documentation

## 2015-05-30 DIAGNOSIS — O9989 Other specified diseases and conditions complicating pregnancy, childbirth and the puerperium: Secondary | ICD-10-CM | POA: Diagnosis not present

## 2015-05-30 DIAGNOSIS — O26899 Other specified pregnancy related conditions, unspecified trimester: Secondary | ICD-10-CM

## 2015-05-30 DIAGNOSIS — Z3A01 Less than 8 weeks gestation of pregnancy: Secondary | ICD-10-CM | POA: Insufficient documentation

## 2015-05-30 LAB — URINALYSIS, ROUTINE W REFLEX MICROSCOPIC
BILIRUBIN URINE: NEGATIVE
Glucose, UA: NEGATIVE mg/dL
Hgb urine dipstick: NEGATIVE
KETONES UR: 15 mg/dL — AB
Leukocytes, UA: NEGATIVE
Nitrite: NEGATIVE
Protein, ur: NEGATIVE mg/dL
Specific Gravity, Urine: 1.025 (ref 1.005–1.030)
UROBILINOGEN UA: 2 mg/dL — AB (ref 0.0–1.0)
pH: 6.5 (ref 5.0–8.0)

## 2015-05-30 LAB — COMPREHENSIVE METABOLIC PANEL
ALT: 17 U/L (ref 14–54)
AST: 15 U/L (ref 15–41)
Albumin: 3.8 g/dL (ref 3.5–5.0)
Alkaline Phosphatase: 75 U/L (ref 38–126)
Anion gap: 6 (ref 5–15)
BILIRUBIN TOTAL: 0.7 mg/dL (ref 0.3–1.2)
BUN: 8 mg/dL (ref 6–20)
CHLORIDE: 104 mmol/L (ref 101–111)
CO2: 24 mmol/L (ref 22–32)
Calcium: 8.9 mg/dL (ref 8.9–10.3)
Creatinine, Ser: 0.73 mg/dL (ref 0.44–1.00)
Glucose, Bld: 90 mg/dL (ref 65–99)
POTASSIUM: 3.8 mmol/L (ref 3.5–5.1)
Sodium: 134 mmol/L — ABNORMAL LOW (ref 135–145)
TOTAL PROTEIN: 7.3 g/dL (ref 6.5–8.1)

## 2015-05-30 LAB — HCG, QUANTITATIVE, PREGNANCY: HCG, BETA CHAIN, QUANT, S: 57631 m[IU]/mL — AB (ref <5)

## 2015-05-30 LAB — CBC
HCT: 39.4 % (ref 36.0–46.0)
Hemoglobin: 13.4 g/dL (ref 12.0–15.0)
MCH: 28.9 pg (ref 26.0–34.0)
MCHC: 34 g/dL (ref 30.0–36.0)
MCV: 84.9 fL (ref 78.0–100.0)
PLATELETS: 273 10*3/uL (ref 150–400)
RBC: 4.64 MIL/uL (ref 3.87–5.11)
RDW: 14.4 % (ref 11.5–15.5)
WBC: 8.9 10*3/uL (ref 4.0–10.5)

## 2015-05-30 LAB — ABO/RH: ABO/RH(D): A POS

## 2015-05-30 LAB — POCT PREGNANCY, URINE: PREG TEST UR: POSITIVE — AB

## 2015-05-30 MED ORDER — PROMETHAZINE HCL 25 MG PO TABS: 12.5000 mg | ORAL_TABLET | Freq: Once | ORAL | Status: DC

## 2015-05-30 MED ORDER — PROMETHAZINE HCL 25 MG/ML IJ SOLN
12.5000 mg | Freq: Once | INTRAMUSCULAR | Status: AC
  Administered 2015-05-30: 12.5 mg via INTRAVENOUS
  Filled 2015-05-30: qty 1

## 2015-05-30 MED ORDER — ACETAMINOPHEN 325 MG PO TABS
650.0000 mg | ORAL_TABLET | Freq: Once | ORAL | Status: AC
  Administered 2015-05-30: 650 mg via ORAL
  Filled 2015-05-30: qty 2

## 2015-05-30 MED ORDER — DEXTROSE 5 % IN LACTATED RINGERS IV BOLUS
1000.0000 mL | Freq: Once | INTRAVENOUS | Status: AC
  Administered 2015-05-30: 1000 mL via INTRAVENOUS
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
## 2015-05-30 NOTE — MAU Note (Signed)
Pains started a few wks ago, went to [facility], found out she was preg,  Was referred to [facility].  The pain continues- stabbing pain in LLQ.  Has been real dizzy, has passed out.   Started spotting on Monday.

## 2015-05-30 NOTE — Discharge Instructions (Signed)
First Trimester of Pregnancy The first trimester of pregnancy is from week 1 until the end of week 12 (months 1 through 3). During this time, your baby will begin to develop inside you. At 6-8 weeks, the eyes and face are formed, and the heartbeat can be seen on ultrasound. At the end of 12 weeks, all the baby's organs are formed. Prenatal care is all the medical care you receive before the birth of your baby. Make sure you get good prenatal care and follow all of your doctor's instructions. HOME CARE  Medicines  Take medicine only as told by your doctor. Some medicines are safe and some are not during pregnancy.  Take your prenatal vitamins as told by your doctor.  Take medicine that helps you poop (stool softener) as needed if your doctor says it is okay. Diet  Eat regular, healthy meals.  Your doctor will tell you the amount of weight gain that is right for you.  Avoid raw meat and uncooked cheese.  If you feel sick to your stomach (nauseous) or throw up (vomit):  Eat 4 or 5 small meals a day instead of 3 large meals.  Try eating a few soda crackers.  Drink liquids between meals instead of during meals.  If you have a hard time pooping (constipation):  Eat high-fiber foods like fresh vegetables, fruit, and whole grains.  Drink enough fluids to keep your pee (urine) clear or pale yellow. Activity and Exercise  Exercise only as told by your doctor. Stop exercising if you have cramps or pain in your lower belly (abdomen) or low back.  Try to avoid standing for long periods of time. Move your legs often if you must stand in one place for a long time.  Avoid heavy lifting.  Wear low-heeled shoes. Sit and stand up straight.  You can have sex unless your doctor tells you not to. Relief of Pain or Discomfort  Wear a good support bra if your breasts are sore.  Take warm water baths (sitz baths) to soothe pain or discomfort caused by hemorrhoids. Use hemorrhoid cream if your  doctor says it is okay.  Rest with your legs raised if you have leg cramps or low back pain.  Wear support hose if you have puffy, bulging veins (varicose veins) in your legs. Raise (elevate) your feet for 15 minutes, 3-4 times a day. Limit salt in your diet. Prenatal Care  Schedule your prenatal visits by the twelfth week of pregnancy.  Write down your questions. Take them to your prenatal visits.  Keep all your prenatal visits as told by your doctor. Safety  Wear your seat belt at all times when driving.  Make a list of emergency phone numbers. The list should include numbers for family, friends, the hospital, and police and fire departments. General Tips  Ask your doctor for a referral to a local prenatal class. Begin classes no later than at the start of month 6 of your pregnancy.  Ask for help if you need counseling or help with nutrition. Your doctor can give you advice or tell you where to go for help.  Do not use hot tubs, steam rooms, or saunas.  Do not douche or use tampons or scented sanitary pads.  Do not cross your legs for long periods of time.  Avoid litter boxes and soil used by cats.  Avoid all smoking, herbs, and alcohol. Avoid drugs not approved by your doctor.  Visit your dentist. At home, brush your teeth   with a soft toothbrush. Be gentle when you floss. GET HELP IF:  You are dizzy.  You have mild cramps or pressure in your lower belly.  You have a nagging pain in your belly area.  You continue to feel sick to your stomach, throw up, or have watery poop (diarrhea).  You have a bad smelling fluid coming from your vagina.  You have pain with peeing (urination).  You have increased puffiness (swelling) in your face, hands, legs, or ankles. GET HELP RIGHT AWAY IF:   You have a fever.  You are leaking fluid from your vagina.  You have spotting or bleeding from your vagina.  You have very bad belly cramping or pain.  You gain or lose weight  rapidly.  You throw up blood. It may look like coffee grounds.  You are around people who have German measles, fifth disease, or chickenpox.  You have a very bad headache.  You have shortness of breath.  You have any kind of trauma, such as from a fall or a car accident. Document Released: 02/04/2008 Document Revised: 01/02/2014 Document Reviewed: 06/28/2013 ExitCare Patient Information 2015 [employer]. This information is not intended to replace advice given to you by your health care provider. Make sure you discuss any questions you have with your health care provider.  

## 2015-05-31 LAB — HIV ANTIBODY (ROUTINE TESTING W REFLEX): HIV Screen 4th Generation wRfx: NONREACTIVE

## 2015-06-11 ENCOUNTER — Emergency Department (HOSPITAL_COMMUNITY)
Admission: EM | Admit: 2015-06-11 | Discharge: 2015-06-12 | Disposition: A | Discharge status: Other Acute Inpatient Hospital | Payer: Medicaid Other | Attending: Emergency Medicine | Admitting: Emergency Medicine

## 2015-06-11 ENCOUNTER — Encounter (HOSPITAL_COMMUNITY): Payer: Self-pay | Admitting: Adult Health

## 2015-06-11 DIAGNOSIS — F1210 Cannabis abuse, uncomplicated: Secondary | ICD-10-CM | POA: Insufficient documentation

## 2015-06-11 DIAGNOSIS — F1410 Cocaine abuse, uncomplicated: Secondary | ICD-10-CM | POA: Diagnosis not present

## 2015-06-11 DIAGNOSIS — Z880 Allergy status to penicillin: Secondary | ICD-10-CM | POA: Insufficient documentation

## 2015-06-11 DIAGNOSIS — O99331 Smoking (tobacco) complicating pregnancy, first trimester: Secondary | ICD-10-CM | POA: Insufficient documentation

## 2015-06-11 DIAGNOSIS — F17200 Nicotine dependence, unspecified, uncomplicated: Secondary | ICD-10-CM | POA: Diagnosis not present

## 2015-06-11 DIAGNOSIS — R4689 Other symptoms and signs involving appearance and behavior: Secondary | ICD-10-CM

## 2015-06-11 DIAGNOSIS — F419 Anxiety disorder, unspecified: Secondary | ICD-10-CM | POA: Insufficient documentation

## 2015-06-11 DIAGNOSIS — R4589 Other symptoms and signs involving emotional state: Secondary | ICD-10-CM

## 2015-06-11 DIAGNOSIS — O99341 Other mental disorders complicating pregnancy, first trimester: Secondary | ICD-10-CM | POA: Diagnosis present

## 2015-06-11 HISTORY — DX: Bipolar disorder, unspecified: F31.9

## 2015-06-11 HISTORY — DX: Schizophrenia, unspecified: F20.9

## 2015-06-11 LAB — COMPREHENSIVE METABOLIC PANEL
ALT: 20 U/L (ref 14–54)
AST: 23 U/L (ref 15–41)
Albumin: 3.8 g/dL (ref 3.5–5.0)
Alkaline Phosphatase: 63 U/L (ref 38–126)
Anion gap: 10 (ref 5–15)
BILIRUBIN TOTAL: 0.4 mg/dL (ref 0.3–1.2)
BUN: 8 mg/dL (ref 6–20)
CHLORIDE: 101 mmol/L (ref 101–111)
CO2: 24 mmol/L (ref 22–32)
CREATININE: 0.72 mg/dL (ref 0.44–1.00)
Calcium: 9.1 mg/dL (ref 8.9–10.3)
Glucose, Bld: 102 mg/dL — ABNORMAL HIGH (ref 65–99)
POTASSIUM: 3.2 mmol/L — AB (ref 3.5–5.1)
Sodium: 135 mmol/L (ref 135–145)
TOTAL PROTEIN: 7 g/dL (ref 6.5–8.1)

## 2015-06-11 LAB — CBC WITH DIFFERENTIAL/PLATELET
BASOS ABS: 0 10*3/uL (ref 0.0–0.1)
BASOS PCT: 0 %
EOS PCT: 4 %
Eosinophils Absolute: 0.4 10*3/uL (ref 0.0–0.7)
HCT: 37.8 % (ref 36.0–46.0)
Hemoglobin: 12.6 g/dL (ref 12.0–15.0)
LYMPHS PCT: 27 %
Lymphs Abs: 2.9 10*3/uL (ref 0.7–4.0)
MCH: 28.7 pg (ref 26.0–34.0)
MCHC: 33.3 g/dL (ref 30.0–36.0)
MCV: 86.1 fL (ref 78.0–100.0)
MONO ABS: 0.6 10*3/uL (ref 0.1–1.0)
Monocytes Relative: 5 %
Neutro Abs: 6.9 10*3/uL (ref 1.7–7.7)
Neutrophils Relative %: 64 %
PLATELETS: 287 10*3/uL (ref 150–400)
RBC: 4.39 MIL/uL (ref 3.87–5.11)
RDW: 13.7 % (ref 11.5–15.5)
WBC: 10.7 10*3/uL — AB (ref 4.0–10.5)

## 2015-06-11 LAB — RAPID URINE DRUG SCREEN, HOSP PERFORMED
AMPHETAMINES: NOT DETECTED
BENZODIAZEPINES: NOT DETECTED
Barbiturates: NOT DETECTED
COCAINE: POSITIVE — AB
OPIATES: NOT DETECTED
Tetrahydrocannabinol: POSITIVE — AB

## 2015-06-11 LAB — PREGNANCY, URINE: Preg Test, Ur: POSITIVE — AB

## 2015-06-11 LAB — ACETAMINOPHEN LEVEL: Acetaminophen (Tylenol), Serum: <10 ug/mL — ABNORMAL LOW (ref 10–30)

## 2015-06-11 LAB — ETHANOL

## 2015-06-11 LAB — SALICYLATE LEVEL: Salicylate Lvl: <4 mg/dL (ref 2.8–30.0)

## 2015-06-11 NOTE — ED Notes (Signed)
Patient being transferred to Pod C, room 20. Report given to RN. Sitter to accompany patient to new room.
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
## 2015-06-11 NOTE — ED Notes (Addendum)
Presents with Suicidal ideation-recently left [facility] and would like to be commited again-plan to OD on street drugs, feels SI for years.  Has used cocaine today. Pt is pregnant

## 2015-06-12 ENCOUNTER — Encounter (HOSPITAL_COMMUNITY): Payer: Self-pay | Admitting: *Deleted

## 2015-06-12 ENCOUNTER — Inpatient Hospital Stay (HOSPITAL_COMMUNITY)
Admission: AD | Admit: 2015-06-12 | Discharge: 2015-06-15 | DRG: 885 | Disposition: A | Discharge status: Home / Self Care | Payer: Medicaid Other | Source: Intra-hospital | Attending: Psychiatry | Admitting: Psychiatry

## 2015-06-12 DIAGNOSIS — Z331 Pregnant state, incidental: Secondary | ICD-10-CM | POA: Diagnosis present

## 2015-06-12 DIAGNOSIS — F17210 Nicotine dependence, cigarettes, uncomplicated: Secondary | ICD-10-CM | POA: Diagnosis not present

## 2015-06-12 DIAGNOSIS — Z91040 Latex allergy status: Secondary | ICD-10-CM | POA: Diagnosis not present

## 2015-06-12 DIAGNOSIS — R1030 Lower abdominal pain, unspecified: Secondary | ICD-10-CM | POA: Diagnosis not present

## 2015-06-12 DIAGNOSIS — F1414 Cocaine abuse with cocaine-induced mood disorder: Secondary | ICD-10-CM | POA: Diagnosis not present

## 2015-06-12 DIAGNOSIS — Z880 Allergy status to penicillin: Secondary | ICD-10-CM | POA: Diagnosis not present

## 2015-06-12 DIAGNOSIS — Z8659 Personal history of other mental and behavioral disorders: Secondary | ICD-10-CM | POA: Diagnosis not present

## 2015-06-12 DIAGNOSIS — F333 Major depressive disorder, recurrent, severe with psychotic symptoms: Secondary | ICD-10-CM | POA: Diagnosis not present

## 2015-06-12 DIAGNOSIS — Z3A08 8 weeks gestation of pregnancy: Secondary | ICD-10-CM | POA: Diagnosis not present

## 2015-06-12 DIAGNOSIS — O99331 Smoking (tobacco) complicating pregnancy, first trimester: Secondary | ICD-10-CM | POA: Diagnosis not present

## 2015-06-12 DIAGNOSIS — O9989 Other specified diseases and conditions complicating pregnancy, childbirth and the puerperium: Secondary | ICD-10-CM | POA: Diagnosis present

## 2015-06-12 DIAGNOSIS — R45851 Suicidal ideations: Secondary | ICD-10-CM | POA: Diagnosis not present

## 2015-06-12 DIAGNOSIS — F4310 Post-traumatic stress disorder, unspecified: Secondary | ICD-10-CM | POA: Diagnosis not present

## 2015-06-12 MED ORDER — ACETAMINOPHEN 325 MG PO TABS: 650.0000 mg | ORAL_TABLET | Freq: Four times a day (QID) | ORAL | Status: DC | PRN

## 2015-06-12 MED ORDER — MAGNESIUM HYDROXIDE 400 MG/5ML PO SUSP: 30.0000 mL | Freq: Every day | ORAL | Status: DC | PRN

## 2015-06-12 MED ORDER — ALUM & MAG HYDROXIDE-SIMETH 200-200-20 MG/5ML PO SUSP: 30.0000 mL | ORAL | Status: DC | PRN

## 2015-06-12 NOTE — ED Notes (Signed)
Patient was given a drink, and diet order was ordered for lunch.

## 2015-06-12 NOTE — Progress Notes (Signed)
Pt accepted to [facility] bed 303-2 by Dr. Dub Mikes, per Sharon, [facility]. Pt can arrive 8pm. Number for RN report is [phone]. Admission is voluntary. Spoke with [facility] re: pt's placement.  Ilean Skill, MSW, LCSW Clinical Social Work, Disposition  06/12/2015 [phone]

## 2015-06-12 NOTE — Progress Notes (Signed)
Pt was a new admit and was not here for group.

## 2015-06-12 NOTE — Tx Team (Addendum)
Initial Interdisciplinary Treatment Plan   PATIENT STRESSORS: Educational concerns Financial difficulties Legal issue Marital or family conflict Medication change or noncompliance Substance abuse Traumatic event   PATIENT STRENGTHS: Ability for insight Average or above average intelligence Capable of independent living General fund of knowledge Motivation for treatment/growth   PROBLEM LIST: Problem List/Patient Goals Date to be addressed Date deferred Reason deferred Estimated date of resolution  Depression/anxiety      Pt is [redacted] weeks pregnant with her 5th child      Crack cocaine       Homeless      Risk for self harm            "I need help dealing with my depression and suicidal thoughts"      "I would like help finding a way to go back to school"             DISCHARGE CRITERIA:  Ability to meet basic life and health needs Adequate post-discharge living arrangements Improved stabilization in mood, thinking, and/or behavior Motivation to continue treatment in a less acute level of care Need for constant or close observation no longer present Verbal commitment to aftercare and medication compliance Withdrawal symptoms are absent or subacute and managed without 24-hour nursing intervention  PRELIMINARY DISCHARGE PLAN: Attend aftercare/continuing care group Attend 12-step recovery group Outpatient therapy Placement in alternative living arrangements  PATIENT/FAMIILY INVOLVEMENT: This treatment plan has been presented to and reviewed with the patient, Mariah Mccarthy, and/or family member.  The patient and family have been given the opportunity to ask questions and make suggestions.  Jesus Gen[facility] 06/12/2015, 11:23 PM

## 2015-06-12 NOTE — ED Notes (Signed)
Patient was given a turkey sandwich bag with a gingerale. 

## 2015-06-12 NOTE — Progress Notes (Signed)
Vol admit, 24 yo AA female, admitted for depression and suicidal thoughts.  Pt states during the admission that she is not currently having suicidal thoughts, but has these self harm thoughts that come and go.  Pt is [redacted] weeks pregnant with her 5th child, and this is an unwanted pregnancy.  Pt presented very flat and sad.  Pt states she has been sleeping in her car outside of her mother's house as her husband and children are living in the home.  Pt is pregnant by her boyfriend who is the father of her two youngest children.  Pt wants help getting back to school and help dealing with her depression and suicidal thoughts.  She was here in July, but has not been taking the medications she was discharged with.  Pt was cooperative with admission.  All paperwork signed.  Pt refused meal and beverage.  Pt re-oriented to unit.  Safety checks q15 minutes initiated.
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
## 2015-06-13 ENCOUNTER — Encounter (HOSPITAL_COMMUNITY): Payer: Self-pay | Admitting: Psychiatry

## 2015-06-13 DIAGNOSIS — F333 Major depressive disorder, recurrent, severe with psychotic symptoms: Principal | ICD-10-CM

## 2015-06-13 DIAGNOSIS — R45851 Suicidal ideations: Secondary | ICD-10-CM

## 2015-06-13 DIAGNOSIS — F4310 Post-traumatic stress disorder, unspecified: Secondary | ICD-10-CM

## 2015-06-13 DIAGNOSIS — F1414 Cocaine abuse with cocaine-induced mood disorder: Secondary | ICD-10-CM | POA: Diagnosis present

## 2015-06-13 MED ORDER — POTASSIUM CHLORIDE CRYS ER 20 MEQ PO TBCR
20.0000 meq | EXTENDED_RELEASE_TABLET | Freq: Two times a day (BID) | ORAL | Status: DC
  Administered 2015-06-13 (×2): 20 meq via ORAL
  Filled 2015-06-13 (×5): qty 1
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
## 2015-06-13 NOTE — Plan of Care (Signed)
Problem: Alteration in mood & ability to function due to Goal: STG-Patient will comply with prescribed medication regimen (Patient will comply with prescribed medication regimen)  Outcome: Progressing Pt compliant with prescribed medication regimen.      

## 2015-06-13 NOTE — Progress Notes (Signed)
[facility] Group Notes:  (Nursing/MHT/Case Management/Adjunct)  Date:  06/13/2015  Time:  4:36 PM  Type of Therapy:  Psychoeducational Skills  Participation Level:  Active  Participation Quality:  Attentive and Sharing  Affect:  Depressed  Cognitive:  Appropriate  Insight:  Appropriate  Engagement in Group:  Engaged  Modes of Intervention:  Activity  Summary of Progress/Problems: Pt did participate in group.  Pt states that her goal is to make a better life for her kids and to get clean and sober.    Chrisangel Eskenazi R Karinne Schmader 06/13/2015, 4:36 PM

## 2015-06-13 NOTE — [facility] Suicide Risk Assessment (Signed)
[facility] INPATIENT:  Family/Significant Other Suicide Prevention Education  Suicide Prevention Education:  Patient Refusal for Family/Significant Other Suicide Prevention Education: The patient Mariah Mccarthy has refused to provide written consent for family/significant other to be provided Family/Significant Other Suicide Prevention Education during admission and/or prior to discharge.  Physician notified.  SPE completed with pt, as pt refused to consent to family contact. SPI pamphlet provided to pt and pt was encouraged to share information with support network, ask questions, and talk about any concerns relating to SPE. Pt denies access to guns/firearms and verbalized understanding of information provided. Mobile Crisis information also provided to pt.   Smart, Ruari Mudgett LCSWA 06/13/2015, 3:35 PM

## 2015-06-13 NOTE — Progress Notes (Signed)
Recreation Therapy Notes  Date: 10.12.2016 Time: 9:30am Location: [address] Group Room   Group Topic: Stress Management  Goal Area(s) Addresses:  Patient will actively participate in stress management techniques presented during session.   Behavioral Response: Did not attend.  Kaori Jumper L Jovon Streetman, LRT/CTRS        Deriyah Kunath L 06/13/2015 1:01 PM 

## 2015-06-13 NOTE — [facility] Group Notes (Signed)
[facility] LCSW Group Therapy  06/13/2015 12:56 PM  Type of Therapy:  Group Therapy  Participation Level:  None  Participation Quality:  Attentive  Affect:  Depressed and Flat  Cognitive:  Lacking  Insight:  Limited  Engagement in Therapy:  None  Modes of Intervention:  Confrontation, Discussion, Education, Exploration, Problem-solving, Rapport Building, Socialization and Support  Summary of Progress/Problems: Feelings around relapse. Mariah Mccarthy was attentive during group but was resistant to active participation in the group setting. At this time, she demonstrates limited insight and with no progress in the group setting. However, Mariah Mccarthy was able to sit quietly until group concluded.   Smart, Mariah Mccarthy LCSWA  06/13/2015, 12:56 PM
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
## 2015-06-13 NOTE — Progress Notes (Signed)
D:Per patient self inventory form pt reports she slept fair last night. She reports a fair appetite, low energy level, poor concentration. She rates depression 10/10, hopelessness 10/10, anxiety 5/10- all on 0-10 scale, 10 being the worse. Pt reports passive SI, Denies HI. Denies AVH. Pt presents with depressed affect. Forwards little on approach. Guarded during interaction.  A:Special checks q 15 mins in place for safety. Medication administered per MD order(see eMAR) Encouragement and support provided.   R:Pt verbally contracts for safety. Safety maintained. Compliant with medication regimen. Will continue to monitor.
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
## 2015-06-14 ENCOUNTER — Encounter (HOSPITAL_COMMUNITY): Payer: Self-pay | Admitting: Emergency Medicine

## 2015-06-14 ENCOUNTER — Emergency Department (HOSPITAL_COMMUNITY): Admission: EM | Admit: 2015-06-14 | Payer: Medicaid Other | Source: Home / Self Care

## 2015-06-14 DIAGNOSIS — Z880 Allergy status to penicillin: Secondary | ICD-10-CM | POA: Insufficient documentation

## 2015-06-14 DIAGNOSIS — O9989 Other specified diseases and conditions complicating pregnancy, childbirth and the puerperium: Secondary | ICD-10-CM | POA: Insufficient documentation

## 2015-06-14 DIAGNOSIS — Z3A08 8 weeks gestation of pregnancy: Secondary | ICD-10-CM | POA: Insufficient documentation

## 2015-06-14 DIAGNOSIS — O99331 Smoking (tobacco) complicating pregnancy, first trimester: Secondary | ICD-10-CM | POA: Insufficient documentation

## 2015-06-14 DIAGNOSIS — F17210 Nicotine dependence, cigarettes, uncomplicated: Secondary | ICD-10-CM | POA: Insufficient documentation

## 2015-06-14 DIAGNOSIS — R1030 Lower abdominal pain, unspecified: Secondary | ICD-10-CM | POA: Insufficient documentation

## 2015-06-14 DIAGNOSIS — Z8659 Personal history of other mental and behavioral disorders: Secondary | ICD-10-CM | POA: Insufficient documentation

## 2015-06-14 DIAGNOSIS — Z91040 Latex allergy status: Secondary | ICD-10-CM | POA: Insufficient documentation

## 2015-06-14 LAB — URINALYSIS, ROUTINE W REFLEX MICROSCOPIC
Bilirubin Urine: NEGATIVE
GLUCOSE, UA: NEGATIVE mg/dL
HGB URINE DIPSTICK: NEGATIVE
KETONES UR: NEGATIVE mg/dL
LEUKOCYTES UA: NEGATIVE
Nitrite: NEGATIVE
PH: 6 (ref 5.0–8.0)
PROTEIN: NEGATIVE mg/dL
Specific Gravity, Urine: 1.02 (ref 1.005–1.030)
Urobilinogen, UA: 0.2 mg/dL (ref 0.0–1.0)

## 2015-06-14 MED ORDER — MAGNESIUM HYDROXIDE 400 MG/5ML PO SUSP: 30.0000 mL | Freq: Every day | ORAL | Status: DC | PRN

## 2015-06-14 MED ORDER — ALUM & MAG HYDROXIDE-SIMETH 200-200-20 MG/5ML PO SUSP: 30.0000 mL | ORAL | Status: DC | PRN

## 2015-06-14 MED ORDER — FLUOXETINE HCL 10 MG PO CAPS
10.0000 mg | ORAL_CAPSULE | Freq: Every day | ORAL | Status: DC
  Filled 2015-06-14: qty 1

## 2015-06-14 MED ORDER — ACETAMINOPHEN 325 MG PO TABS: 650.0000 mg | ORAL_TABLET | Freq: Four times a day (QID) | ORAL | Status: DC | PRN

## 2015-06-14 MED ORDER — FLUOXETINE HCL 10 MG PO CAPS
10.0000 mg | ORAL_CAPSULE | Freq: Every day | ORAL | Status: DC
  Filled 2015-06-14 (×4): qty 1

## 2015-06-14 MED ORDER — DIPHENHYDRAMINE HCL 25 MG PO CAPS: 50.0000 mg | ORAL_CAPSULE | Freq: Every evening | ORAL | Status: DC | PRN

## 2015-06-14 NOTE — Progress Notes (Signed)
Pt has been observed on the phone a couple of times this evening, and sitting in the dayroom watching TV.  Conversation was minimal, but she does Engineer, civil (consulting)contract for safety with writer.  Pt presents flat/sullen.  She denies HI/AVH.  She denies any pain or withdrawal symptoms.  She requested a cup of ice at this time.  Medication choices are minimal as pt is [redacted] weeks pregnant.  Pt was told to make her needs known to staff.  Support and encouragement offered.
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
## 2015-06-14 NOTE — Progress Notes (Signed)
Patient ID: Mariah Mccarthy, female   DOB: [birth year], 24 y.o.   MRN: [id number]  Adult Psychoeducational Group Note  Date:  06/14/2015 Time: 09:15  Group Topic/Focus:  Making Healthy Choices:   The focus of this group is to help patients identify negative/unhealthy choices they were using prior to admission and identify positive/healthier coping strategies to replace them upon discharge.  Participation Level:  Did Not Attend  Participation Quality: n/a  Affect: n/a  Cognitive: n/a  Insight: n/a  Engagement in Group: n/a  Modes of Intervention:  Discussion, Education, Orientation and Support  Additional Comments:  Pt did not attend. Pt in bed asleep.  Aurora Maskwyman, [name] E 06/14/2015, 1:37 PM

## 2015-06-14 NOTE — Progress Notes (Signed)
Patient ID: Mariah BanisterDaveda Bedgood, female   DOB: Jun 07, 1991, 24 y.o.   MRN: [id number]  Pt currently presents with a flat affect and depressed behavior. Pt has remained in bed for most of the day today. Pt main complaint is abdominal cramping. Pt reports worsening cramping this afternoon with sweating and chills. Pt denies any bleeding or spotting.   Pt provided with medications per providers orders. Pt's labs and vitals were monitored throughout the day. Pt supported emotionally and encouraged to express concerns and questions. Pt educated on medications. Vitals monitored, Md notified of cramping and results.   Pt's safety ensured with 15 minute and environmental checks. Pt currently denies SI/HI and A/V hallucinations. Pt verbally agrees to seek staff if SI/HI or A/VH occurs and to consult with staff before acting on these thoughts. [facility] for abdominal symptoms. Will continue POC.

## 2015-06-14 NOTE — ED Notes (Signed)
[facility] for [facility]. Pt is [redacted] weeks pregnant. C/o lower abdominal cramping x1 day, denies bleeding. Denies urinary symptoms.

## 2015-06-14 NOTE — Discharge Instructions (Signed)

## 2015-06-15 MED ORDER — FLUOXETINE HCL 10 MG PO CAPS: 10.0000 mg | ORAL_CAPSULE | Freq: Every day | ORAL | Status: AC

## 2015-06-15 NOTE — Progress Notes (Signed)
Pt observed sitting in the dayroom at this time watching TV.  She just returned from the ED where she was assessed for abdominal cramping.  She is [redacted] weeks pregnant.  She states the ED did not give her any medications while she was there.  She reported that the abdominal pain she was having has decreased.  She denies SI/HI/AVH at this time.  She says she is going to go to [employer]araoke tonight.  Later, after karaoke, pt had a phone call from her mother who told her that the children had been taken by CPS this evening.  She wanted to know what she could to do get out of here, because she needed to get her kids back.  Pt is visibly upset about the situation.  Pt was informed that she would have to talk to the MD in the AM about her situation, and it would be at his discretion to discharge her or not.  Pt voiced understanding and remained calm with Clinical research associatewriter.  Pt declined the benadryl that was ordered for her as a sleep aid.  Pt has minimal interaction with peers and staff.  Pt encouraged to make her needs known to staff.  Discharge plans are in process.  Support and encouragement offered.  Safety maintained with q15 minute checks.
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
## 2015-06-15 NOTE — Progress Notes (Signed)
Discharge note: Patient discharge with "baby daddy's mother". Patient denied SI/HI/AVH. Patient stated all belongings were present in locker.
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
## 2015-06-15 NOTE — Progress Notes (Signed)
Patient did attend the evening karaoke group. Pt was engaged, supportive, and participated by singing a song.  

## 2015-06-15 NOTE — Progress Notes (Signed)
D:Patient States she slept fair last night. She states her appetite has been good and her energy level normal. She rates her depression at a 5, her hopelessness at a 3, and her anxiety at a 3. She denies SI/HI/AVH.  She states she is cramping has pain at a 5 but denies wanting pain medication. She states her goal is to go home. Patient is visible in the milieu and on the phone. She's states she is calm but anxious about current situation. Patient states she was has SI yesterday but she wants to be there for her kids. She states it's not fair to her kids that she's been like this. Her plan when she gets back is to follow up at her apts and help her mom out at the house.  A: Medication was offered and education was provided. Encouragement was given. 1:1 was done with writer to discuss plan after discharge.  R: Patient was cooperative but did not want to take medication due to the fact that she is pregnant.

## 2015-06-15 NOTE — Progress Notes (Signed)
  [facility] Adult Case Management Discharge Plan :  Will you be returning to the same living situation after discharge:  Yes,  home with mother At discharge, do you have transportation home?: Yes,  pt's mother Do you have the ability to pay for your medications: Yes,  [place] med[place]  Release of information consent forms completed and submitted to medical records by CSW.  Patient to Follow up at: Follow-up Information    Follow up with [facility]  On 06/19/2015.   Why:  Arrive at 8:45 for hospital follow-up appt/medication management/assessment for mental health services. Thank you.    Contact information:   [address] [place], [place] [address] Phone: [phone] Fax: [phone]      Follow up with [facility].   Why:  Please follow-up with [facility] to determine start date for program. [facility] stated that you must call for this information.    Contact information:   [address] [place], [place] [address] Phone: [phone] Fax: [phone]      Patient denies SI/HI: Yes,  during group/self report.     Safety Planning and Suicide Prevention discussed: Yes,  SPE completed with pt, as she refused to consent to family contact. SPI pamphlet and mobile crisis information provided to pt and she was encouraged to share this information with her support network,.   Have you used any form of tobacco in the last 30 days? (Cigarettes, Smokeless Tobacco, Cigars, and/or Pipes): Yes  Has patient been referred to the Quitline?: Patient refused referral  Smart, Lebron QuamHeather LCSWA 06/15/2015, 11:01 AM
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
## 2015-06-15 NOTE — Progress Notes (Signed)
Patient inventory from 06/14/15 picked up on 06/15/15. Signed by RN writing note. Patient did not sign inventory.

## 2015-07-10 LAB — OB RESULTS CONSOLE HEPATITIS B SURFACE ANTIGEN: Hepatitis B Surface Ag: NEGATIVE

## 2015-07-10 LAB — OB RESULTS CONSOLE RUBELLA ANTIBODY, IGM: RUBELLA: IMMUNE

## 2015-09-02 NOTE — L&D Delivery Note (Signed)
Delivery Note Upon entering the room at 7:38 PM, a viable female had already been delivered via Vaginal, Spontaneous Delivery (Presentation: Right Occiput Anterior).  APGAR: 8, 9; weight pending .   Placenta status: Intact, Spontaneous.  Cord: 3 vessels with the following complications: None.  Cord pH: n/a  Anesthesia: None  Episiotomy: None Lacerations: None Suture Repair: none Est. Blood Loss (mL): 889  Mom to postpartum.  Baby to NICU.  Jinny BlossomKaty D Mayo 12/12/2015, 8:17 PM  OB fellow attestation: I was gloved and involved in the care of this patientt; I agree with above documentation in the resident's note.  Patient meets criteria for PPH with total EBL. She received 600mcg Cytotec buccally and manual evacuation of the LUS.   Federico FlakeKimberly Niles Newton, MD 9:49 PM

## 2015-10-18 ENCOUNTER — Encounter: Payer: Self-pay | Admitting: Obstetrics and Gynecology

## 2015-10-18 ENCOUNTER — Ambulatory Visit (INDEPENDENT_AMBULATORY_CARE_PROVIDER_SITE_OTHER): Admitting: Obstetrics and Gynecology

## 2015-10-18 VITALS — BP 105/72 | HR 85 | Temp 97.8°F | Wt 152.9 lb

## 2015-10-18 DIAGNOSIS — O99323 Drug use complicating pregnancy, third trimester: Secondary | ICD-10-CM | POA: Diagnosis not present

## 2015-10-18 DIAGNOSIS — O09213 Supervision of pregnancy with history of pre-term labor, third trimester: Secondary | ICD-10-CM

## 2015-10-18 DIAGNOSIS — O99353 Diseases of the nervous system complicating pregnancy, third trimester: Secondary | ICD-10-CM

## 2015-10-18 DIAGNOSIS — O99320 Drug use complicating pregnancy, unspecified trimester: Secondary | ICD-10-CM | POA: Insufficient documentation

## 2015-10-18 DIAGNOSIS — O0933 Supervision of pregnancy with insufficient antenatal care, third trimester: Secondary | ICD-10-CM | POA: Insufficient documentation

## 2015-10-18 DIAGNOSIS — Z23 Encounter for immunization: Secondary | ICD-10-CM | POA: Diagnosis not present

## 2015-10-18 DIAGNOSIS — F1910 Other psychoactive substance abuse, uncomplicated: Secondary | ICD-10-CM | POA: Diagnosis not present

## 2015-10-18 DIAGNOSIS — O09293 Supervision of pregnancy with other poor reproductive or obstetric history, third trimester: Secondary | ICD-10-CM | POA: Diagnosis not present

## 2015-10-18 DIAGNOSIS — Z8632 Personal history of gestational diabetes: Secondary | ICD-10-CM

## 2015-10-18 DIAGNOSIS — O09893 Supervision of other high risk pregnancies, third trimester: Secondary | ICD-10-CM | POA: Insufficient documentation

## 2015-10-18 DIAGNOSIS — O99891 Other specified diseases and conditions complicating pregnancy: Secondary | ICD-10-CM

## 2015-10-18 DIAGNOSIS — O0993 Supervision of high risk pregnancy, unspecified, third trimester: Secondary | ICD-10-CM

## 2015-10-18 DIAGNOSIS — O0990 Supervision of high risk pregnancy, unspecified, unspecified trimester: Secondary | ICD-10-CM | POA: Insufficient documentation

## 2015-10-18 DIAGNOSIS — O99350 Diseases of the nervous system complicating pregnancy, unspecified trimester: Secondary | ICD-10-CM

## 2015-10-18 DIAGNOSIS — Z8659 Personal history of other mental and behavioral disorders: Secondary | ICD-10-CM | POA: Insufficient documentation

## 2015-10-18 DIAGNOSIS — G40909 Epilepsy, unspecified, not intractable, without status epilepticus: Secondary | ICD-10-CM | POA: Diagnosis not present

## 2015-10-18 DIAGNOSIS — O9989 Other specified diseases and conditions complicating pregnancy, childbirth and the puerperium: Secondary | ICD-10-CM | POA: Diagnosis not present

## 2015-10-18 LAB — POCT URINALYSIS DIP (DEVICE)
GLUCOSE, UA: NEGATIVE mg/dL
Hgb urine dipstick: NEGATIVE
Ketones, ur: NEGATIVE mg/dL
Leukocytes, UA: NEGATIVE
Nitrite: NEGATIVE
PROTEIN: 30 mg/dL — AB
Specific Gravity, Urine: >=1.03 (ref 1.005–1.030)
UROBILINOGEN UA: 1 mg/dL (ref 0.0–1.0)
pH: 6 (ref 5.0–8.0)

## 2015-10-18 LAB — CBC
HCT: 33.9 % — ABNORMAL LOW (ref 36.0–46.0)
HEMOGLOBIN: 11 g/dL — AB (ref 12.0–15.0)
MCH: 28.9 pg (ref 26.0–34.0)
MCHC: 32.4 g/dL (ref 30.0–36.0)
MCV: 89 fL (ref 78.0–100.0)
MPV: 10.8 fL (ref 8.6–12.4)
PLATELETS: 256 10*3/uL (ref 150–400)
RBC: 3.81 MIL/uL — ABNORMAL LOW (ref 3.87–5.11)
RDW: 14.2 % (ref 11.5–15.5)
WBC: 10.8 10*3/uL — ABNORMAL HIGH (ref 4.0–10.5)

## 2015-10-18 MED ORDER — TETANUS-DIPHTH-ACELL PERTUSSIS 5-2.5-18.5 LF-MCG/0.5 IM SUSP
0.5000 mL | Freq: Once | INTRAMUSCULAR | Status: AC
  Administered 2015-10-18: 0.5 mL via INTRAMUSCULAR

## 2015-10-18 NOTE — Progress Notes (Signed)
Pain- "spasms in stomach" 28 wk labs today
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
## 2015-10-19 LAB — HIV ANTIBODY (ROUTINE TESTING W REFLEX): HIV: NONREACTIVE

## 2015-10-19 LAB — GLUCOSE TOLERANCE, 1 HOUR (50G) W/O FASTING: GLUCOSE 1 HOUR GTT: 132 mg/dL (ref 70–140)

## 2015-10-19 LAB — RPR

## 2015-11-01 ENCOUNTER — Encounter: Payer: Self-pay | Admitting: Obstetrics & Gynecology

## 2015-12-10 ENCOUNTER — Inpatient Hospital Stay (HOSPITAL_COMMUNITY)
Admission: AD | Admit: 2015-12-10 | Discharge: 2015-12-14 | DRG: 774 | Disposition: A | Discharge status: Home / Self Care | Payer: Medicaid Other | Source: Ambulatory Visit | Attending: Family Medicine | Admitting: Family Medicine

## 2015-12-10 ENCOUNTER — Observation Stay (HOSPITAL_COMMUNITY): Payer: Medicaid Other

## 2015-12-10 ENCOUNTER — Encounter (HOSPITAL_COMMUNITY): Payer: Self-pay | Admitting: *Deleted

## 2015-12-10 DIAGNOSIS — O42013 Preterm premature rupture of membranes, onset of labor within 24 hours of rupture, third trimester: Secondary | ICD-10-CM | POA: Diagnosis present

## 2015-12-10 DIAGNOSIS — O42113 Preterm premature rupture of membranes, onset of labor more than 24 hours following rupture, third trimester: Secondary | ICD-10-CM | POA: Diagnosis not present

## 2015-12-10 DIAGNOSIS — F1410 Cocaine abuse, uncomplicated: Secondary | ICD-10-CM | POA: Diagnosis present

## 2015-12-10 DIAGNOSIS — F1290 Cannabis use, unspecified, uncomplicated: Secondary | ICD-10-CM | POA: Diagnosis not present

## 2015-12-10 DIAGNOSIS — Z3493 Encounter for supervision of normal pregnancy, unspecified, third trimester: Secondary | ICD-10-CM

## 2015-12-10 DIAGNOSIS — O09293 Supervision of pregnancy with other poor reproductive or obstetric history, third trimester: Secondary | ICD-10-CM

## 2015-12-10 DIAGNOSIS — F332 Major depressive disorder, recurrent severe without psychotic features: Secondary | ICD-10-CM

## 2015-12-10 DIAGNOSIS — O9832 Other infections with a predominantly sexual mode of transmission complicating childbirth: Secondary | ICD-10-CM | POA: Diagnosis present

## 2015-12-10 DIAGNOSIS — G40909 Epilepsy, unspecified, not intractable, without status epilepticus: Secondary | ICD-10-CM

## 2015-12-10 DIAGNOSIS — A568 Sexually transmitted chlamydial infection of other sites: Secondary | ICD-10-CM | POA: Diagnosis present

## 2015-12-10 DIAGNOSIS — O99333 Smoking (tobacco) complicating pregnancy, third trimester: Secondary | ICD-10-CM

## 2015-12-10 DIAGNOSIS — O0990 Supervision of high risk pregnancy, unspecified, unspecified trimester: Secondary | ICD-10-CM

## 2015-12-10 DIAGNOSIS — O0933 Supervision of pregnancy with insufficient antenatal care, third trimester: Secondary | ICD-10-CM

## 2015-12-10 DIAGNOSIS — Z3A34 34 weeks gestation of pregnancy: Secondary | ICD-10-CM

## 2015-12-10 DIAGNOSIS — O99344 Other mental disorders complicating childbirth: Secondary | ICD-10-CM | POA: Diagnosis present

## 2015-12-10 DIAGNOSIS — O99350 Diseases of the nervous system complicating pregnancy, unspecified trimester: Secondary | ICD-10-CM

## 2015-12-10 DIAGNOSIS — Z880 Allergy status to penicillin: Secondary | ICD-10-CM

## 2015-12-10 DIAGNOSIS — O99343 Other mental disorders complicating pregnancy, third trimester: Secondary | ICD-10-CM

## 2015-12-10 DIAGNOSIS — F1210 Cannabis abuse, uncomplicated: Secondary | ICD-10-CM | POA: Diagnosis present

## 2015-12-10 DIAGNOSIS — O99324 Drug use complicating childbirth: Secondary | ICD-10-CM | POA: Diagnosis present

## 2015-12-10 DIAGNOSIS — O9989 Other specified diseases and conditions complicating pregnancy, childbirth and the puerperium: Secondary | ICD-10-CM

## 2015-12-10 DIAGNOSIS — Z8659 Personal history of other mental and behavioral disorders: Secondary | ICD-10-CM

## 2015-12-10 DIAGNOSIS — F17210 Nicotine dependence, cigarettes, uncomplicated: Secondary | ICD-10-CM | POA: Diagnosis present

## 2015-12-10 DIAGNOSIS — O4700 False labor before 37 completed weeks of gestation, unspecified trimester: Secondary | ICD-10-CM | POA: Diagnosis present

## 2015-12-10 DIAGNOSIS — F209 Schizophrenia, unspecified: Secondary | ICD-10-CM | POA: Diagnosis present

## 2015-12-10 DIAGNOSIS — Z3A33 33 weeks gestation of pregnancy: Secondary | ICD-10-CM

## 2015-12-10 DIAGNOSIS — O99353 Diseases of the nervous system complicating pregnancy, third trimester: Secondary | ICD-10-CM

## 2015-12-10 DIAGNOSIS — O99323 Drug use complicating pregnancy, third trimester: Secondary | ICD-10-CM | POA: Diagnosis not present

## 2015-12-10 DIAGNOSIS — O99320 Drug use complicating pregnancy, unspecified trimester: Secondary | ICD-10-CM | POA: Diagnosis present

## 2015-12-10 DIAGNOSIS — O99334 Smoking (tobacco) complicating childbirth: Secondary | ICD-10-CM | POA: Diagnosis present

## 2015-12-10 DIAGNOSIS — O4290 Premature rupture of membranes, unspecified as to length of time between rupture and onset of labor, unspecified weeks of gestation: Secondary | ICD-10-CM

## 2015-12-10 DIAGNOSIS — Z8632 Personal history of gestational diabetes: Secondary | ICD-10-CM

## 2015-12-10 DIAGNOSIS — F319 Bipolar disorder, unspecified: Secondary | ICD-10-CM | POA: Diagnosis present

## 2015-12-10 DIAGNOSIS — Z91040 Latex allergy status: Secondary | ICD-10-CM

## 2015-12-10 DIAGNOSIS — F1910 Other psychoactive substance abuse, uncomplicated: Secondary | ICD-10-CM | POA: Diagnosis present

## 2015-12-10 DIAGNOSIS — O99354 Diseases of the nervous system complicating childbirth: Secondary | ICD-10-CM | POA: Diagnosis present

## 2015-12-10 DIAGNOSIS — Z801 Family history of malignant neoplasm of trachea, bronchus and lung: Secondary | ICD-10-CM

## 2015-12-10 DIAGNOSIS — F1490 Cocaine use, unspecified, uncomplicated: Secondary | ICD-10-CM

## 2015-12-10 DIAGNOSIS — O24419 Gestational diabetes mellitus in pregnancy, unspecified control: Secondary | ICD-10-CM

## 2015-12-10 LAB — CBC
HEMATOCRIT: 32.1 % — AB (ref 36.0–46.0)
HEMOGLOBIN: 10.7 g/dL — AB (ref 12.0–15.0)
MCH: 28.7 pg (ref 26.0–34.0)
MCHC: 33.3 g/dL (ref 30.0–36.0)
MCV: 86.1 fL (ref 78.0–100.0)
Platelets: 244 10*3/uL (ref 150–400)
RBC: 3.73 MIL/uL — AB (ref 3.87–5.11)
RDW: 14.5 % (ref 11.5–15.5)
WBC: 10.7 10*3/uL — ABNORMAL HIGH (ref 4.0–10.5)

## 2015-12-10 LAB — URINALYSIS, ROUTINE W REFLEX MICROSCOPIC
Bilirubin Urine: NEGATIVE
Glucose, UA: NEGATIVE mg/dL
HGB URINE DIPSTICK: NEGATIVE
Ketones, ur: NEGATIVE mg/dL
Leukocytes, UA: NEGATIVE
NITRITE: NEGATIVE
Protein, ur: NEGATIVE mg/dL
SPECIFIC GRAVITY, URINE: 1.015 (ref 1.005–1.030)
pH: 7 (ref 5.0–8.0)

## 2015-12-10 LAB — TYPE AND SCREEN
ABO/RH(D): A POS
ANTIBODY SCREEN: NEGATIVE

## 2015-12-10 LAB — POCT FERN TEST: POCT FERN TEST: POSITIVE

## 2015-12-10 LAB — WET PREP, GENITAL
Clue Cells Wet Prep HPF POC: NONE SEEN
Sperm: NONE SEEN
Trich, Wet Prep: NONE SEEN
Yeast Wet Prep HPF POC: NONE SEEN

## 2015-12-10 LAB — RAPID URINE DRUG SCREEN, HOSP PERFORMED
AMPHETAMINES: NOT DETECTED
Barbiturates: NOT DETECTED
Benzodiazepines: NOT DETECTED
COCAINE: POSITIVE — AB
OPIATES: NOT DETECTED
TETRAHYDROCANNABINOL: NOT DETECTED

## 2015-12-10 LAB — OB RESULTS CONSOLE GBS: STREP GROUP B AG: NEGATIVE

## 2015-12-10 LAB — GROUP B STREP BY PCR: Group B strep by PCR: NEGATIVE

## 2015-12-10 LAB — OB RESULTS CONSOLE GC/CHLAMYDIA: Gonorrhea: NEGATIVE

## 2015-12-10 MED ORDER — LIDOCAINE HCL (PF) 1 % IJ SOLN
30.0000 mL | INTRAMUSCULAR | Status: DC | PRN
  Filled 2015-12-10: qty 30

## 2015-12-10 MED ORDER — DIPHENHYDRAMINE HCL 50 MG/ML IJ SOLN: 12.5000 mg | INTRAMUSCULAR | Status: DC | PRN

## 2015-12-10 MED ORDER — BETAMETHASONE SOD PHOS & ACET 6 (3-3) MG/ML IJ SUSP
12.0000 mg | Freq: Once | INTRAMUSCULAR | Status: AC
  Administered 2015-12-10: 12 mg via INTRAMUSCULAR
  Filled 2015-12-10: qty 2

## 2015-12-10 MED ORDER — EPHEDRINE 5 MG/ML INJ
10.0000 mg | INTRAVENOUS | Status: DC | PRN
  Filled 2015-12-10: qty 2

## 2015-12-10 MED ORDER — ONDANSETRON HCL 4 MG/2ML IJ SOLN: 4.0000 mg | Freq: Four times a day (QID) | INTRAMUSCULAR | Status: DC | PRN

## 2015-12-10 MED ORDER — OXYCODONE-ACETAMINOPHEN 5-325 MG PO TABS
1.0000 | ORAL_TABLET | ORAL | Status: DC | PRN
Start: 2015-12-10 — End: 2015-12-12

## 2015-12-10 MED ORDER — PHENYLEPHRINE 40 MCG/ML (10ML) SYRINGE FOR IV PUSH (FOR BLOOD PRESSURE SUPPORT)
80.0000 ug | PREFILLED_SYRINGE | INTRAVENOUS | Status: DC | PRN
  Filled 2015-12-10: qty 2

## 2015-12-10 MED ORDER — LACTATED RINGERS IV SOLN
INTRAVENOUS | Status: DC
  Administered 2015-12-10 – 2015-12-12 (×5): via INTRAVENOUS

## 2015-12-10 MED ORDER — LACTATED RINGERS IV SOLN
2.5000 [IU]/h | INTRAVENOUS | Status: DC
  Filled 2015-12-10: qty 4

## 2015-12-10 MED ORDER — PHENYLEPHRINE 40 MCG/ML (10ML) SYRINGE FOR IV PUSH (FOR BLOOD PRESSURE SUPPORT)
80.0000 ug | PREFILLED_SYRINGE | INTRAVENOUS | Status: DC | PRN
  Filled 2015-12-10: qty 20
  Filled 2015-12-10: qty 2

## 2015-12-10 MED ORDER — LACTATED RINGERS IV SOLN
500.0000 mL | Freq: Once | INTRAVENOUS | Status: AC
  Administered 2015-12-11: 500 mL via INTRAVENOUS

## 2015-12-10 MED ORDER — FLEET ENEMA 7-19 GM/118ML RE ENEM: 1.0000 | ENEMA | RECTAL | Status: DC | PRN

## 2015-12-10 MED ORDER — VANCOMYCIN HCL IN DEXTROSE 1-5 GM/200ML-% IV SOLN
1000.0000 mg | Freq: Two times a day (BID) | INTRAVENOUS | Status: DC
  Administered 2015-12-10 – 2015-12-12 (×4): 1000 mg via INTRAVENOUS
  Filled 2015-12-10 (×5): qty 200

## 2015-12-10 MED ORDER — OXYTOCIN BOLUS FROM INFUSION
500.0000 mL | INTRAVENOUS | Status: DC
  Administered 2015-12-12: 500 mL via INTRAVENOUS

## 2015-12-10 MED ORDER — OXYCODONE-ACETAMINOPHEN 5-325 MG PO TABS: 2.0000 | ORAL_TABLET | ORAL | Status: DC | PRN

## 2015-12-10 MED ORDER — FENTANYL 2.5 MCG/ML BUPIVACAINE 1/10 % EPIDURAL INFUSION (WH - ANES)
14.0000 mL/h | INTRAMUSCULAR | Status: DC | PRN
  Filled 2015-12-10: qty 125

## 2015-12-10 MED ORDER — ACETAMINOPHEN 325 MG PO TABS: 650.0000 mg | ORAL_TABLET | ORAL | Status: DC | PRN

## 2015-12-10 MED ORDER — FENTANYL CITRATE (PF) 100 MCG/2ML IJ SOLN
100.0000 ug | INTRAMUSCULAR | Status: DC | PRN
  Administered 2015-12-11 – 2015-12-12 (×9): 100 ug via INTRAVENOUS
  Filled 2015-12-10 (×9): qty 2

## 2015-12-10 MED ORDER — LACTATED RINGERS IV SOLN: 500.0000 mL | INTRAVENOUS | Status: DC | PRN

## 2015-12-10 MED ORDER — CITRIC ACID-SODIUM CITRATE 334-500 MG/5ML PO SOLN: 30.0000 mL | ORAL | Status: DC | PRN

## 2015-12-10 NOTE — MAU Note (Signed)
Pt reports cramping and contractions.
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
## 2015-12-11 DIAGNOSIS — Z91040 Latex allergy status: Secondary | ICD-10-CM | POA: Diagnosis not present

## 2015-12-11 DIAGNOSIS — O99354 Diseases of the nervous system complicating childbirth: Secondary | ICD-10-CM | POA: Diagnosis present

## 2015-12-11 DIAGNOSIS — F209 Schizophrenia, unspecified: Secondary | ICD-10-CM | POA: Diagnosis present

## 2015-12-11 DIAGNOSIS — Z3A34 34 weeks gestation of pregnancy: Secondary | ICD-10-CM | POA: Diagnosis not present

## 2015-12-11 DIAGNOSIS — O42013 Preterm premature rupture of membranes, onset of labor within 24 hours of rupture, third trimester: Secondary | ICD-10-CM | POA: Diagnosis present

## 2015-12-11 DIAGNOSIS — O99344 Other mental disorders complicating childbirth: Secondary | ICD-10-CM | POA: Diagnosis present

## 2015-12-11 DIAGNOSIS — G40909 Epilepsy, unspecified, not intractable, without status epilepticus: Secondary | ICD-10-CM | POA: Diagnosis present

## 2015-12-11 DIAGNOSIS — F17210 Nicotine dependence, cigarettes, uncomplicated: Secondary | ICD-10-CM | POA: Diagnosis present

## 2015-12-11 DIAGNOSIS — O99334 Smoking (tobacco) complicating childbirth: Secondary | ICD-10-CM | POA: Diagnosis present

## 2015-12-11 DIAGNOSIS — Z801 Family history of malignant neoplasm of trachea, bronchus and lung: Secondary | ICD-10-CM | POA: Diagnosis not present

## 2015-12-11 DIAGNOSIS — F1290 Cannabis use, unspecified, uncomplicated: Secondary | ICD-10-CM | POA: Diagnosis not present

## 2015-12-11 DIAGNOSIS — A568 Sexually transmitted chlamydial infection of other sites: Secondary | ICD-10-CM | POA: Diagnosis present

## 2015-12-11 DIAGNOSIS — O42113 Preterm premature rupture of membranes, onset of labor more than 24 hours following rupture, third trimester: Secondary | ICD-10-CM | POA: Diagnosis not present

## 2015-12-11 DIAGNOSIS — F1490 Cocaine use, unspecified, uncomplicated: Secondary | ICD-10-CM | POA: Diagnosis not present

## 2015-12-11 DIAGNOSIS — Z880 Allergy status to penicillin: Secondary | ICD-10-CM | POA: Diagnosis not present

## 2015-12-11 DIAGNOSIS — F1410 Cocaine abuse, uncomplicated: Secondary | ICD-10-CM | POA: Diagnosis present

## 2015-12-11 DIAGNOSIS — O99324 Drug use complicating childbirth: Secondary | ICD-10-CM | POA: Diagnosis present

## 2015-12-11 DIAGNOSIS — O9832 Other infections with a predominantly sexual mode of transmission complicating childbirth: Secondary | ICD-10-CM | POA: Diagnosis present

## 2015-12-11 DIAGNOSIS — F319 Bipolar disorder, unspecified: Secondary | ICD-10-CM | POA: Diagnosis present

## 2015-12-11 DIAGNOSIS — F1210 Cannabis abuse, uncomplicated: Secondary | ICD-10-CM | POA: Diagnosis present

## 2015-12-11 LAB — RPR: RPR Ser Ql: NONREACTIVE

## 2015-12-11 LAB — GC/CHLAMYDIA PROBE AMP (~~LOC~~) NOT AT ARMC
CHLAMYDIA, DNA PROBE: POSITIVE — AB
NEISSERIA GONORRHEA: NEGATIVE

## 2015-12-11 MED ORDER — ZOLPIDEM TARTRATE 5 MG PO TABS
5.0000 mg | ORAL_TABLET | Freq: Every evening | ORAL | Status: DC | PRN
  Administered 2015-12-11: 5 mg via ORAL
  Filled 2015-12-11: qty 1

## 2015-12-11 MED ORDER — NALBUPHINE HCL 10 MG/ML IJ SOLN
10.0000 mg | Freq: Once | INTRAMUSCULAR | Status: AC
  Administered 2015-12-11: 10 mg via INTRAVENOUS
  Filled 2015-12-11: qty 1

## 2015-12-11 MED ORDER — OXYTOCIN 10 UNIT/ML IJ SOLN
1.0000 m[IU]/min | INTRAVENOUS | Status: DC
  Administered 2015-12-11: 2 m[IU]/min via INTRAVENOUS
  Filled 2015-12-11: qty 4

## 2015-12-11 MED ORDER — MISOPROSTOL 25 MCG QUARTER TABLET
25.0000 ug | ORAL_TABLET | ORAL | Status: DC
  Administered 2015-12-11 – 2015-12-12 (×3): 25 ug via VAGINAL
  Filled 2015-12-11: qty 0.25
  Filled 2015-12-11 (×3): qty 1
  Filled 2015-12-11 (×2): qty 0.25
  Filled 2015-12-11: qty 1

## 2015-12-11 MED ORDER — TERBUTALINE SULFATE 1 MG/ML IJ SOLN: 0.2500 mg | Freq: Once | INTRAMUSCULAR | Status: DC | PRN

## 2015-12-11 MED ORDER — BETAMETHASONE SOD PHOS & ACET 6 (3-3) MG/ML IJ SUSP
12.0000 mg | Freq: Once | INTRAMUSCULAR | Status: AC
  Administered 2015-12-11: 12 mg via INTRAMUSCULAR
  Filled 2015-12-11: qty 2

## 2015-12-11 NOTE — Progress Notes (Signed)
LABOR PROGRESS NOTE  Mariah BanisterDaveda Mccarthy is a 25 y.o. [id number] at 2178w5d  admitted for PPROM  Subjective: Patient reports that she is feeling more pressure/ pain.  Though not ready for pain medication just yet.  Objective: BP 114/63 mmHg  Pulse 83  Temp(Src) 98.7 F (37.1 C) (Oral)  Resp 18  Ht 5\' 7"  (1.702 m)  Wt 168 lb (76.204 kg)  BMI 26.31 kg/m2  SpO2 98%  LMP 04/12/2015 or  Filed Vitals:   12/11/15 0027 12/11/15 0200 12/11/15 0219 12/11/15 0534  BP: 92/45  95/47 114/63  Pulse: 83  80 83  Temp: 98.4 F (36.9 C) 98.7 F (37.1 C)    TempSrc: Oral Oral    Resp:   18 18  Height:      Weight:      SpO2:       Gen: awake, alert, well appearing female, NAD Pulm: breathing normally on RA Psych: pleasant, normal speech, mood stable  Fetal monitoringBaseline: 140 bpm, Variability: Good {> 6 bpm), Accelerations: Reactive and Decelerations: Absent Uterine activityFrequency: Every 3-6 minutes   Dilation: 3 Effacement (%): 50 Station: -3 Presentation: Vertex Exam by:: Dr. Nadine CountsGottschalk  Labs: Lab Results  Component Value Date   WBC 10.7* 12/10/2015   HGB 10.7* 12/10/2015   HCT 32.1* 12/10/2015   MCV 86.1 12/10/2015   PLT 244 12/10/2015    Patient Active Problem List   Diagnosis Date Noted  . Preterm premature rupture of membranes (PPROM) with onset of labor within 24 hours of rupture in third trimester, antepartum 12/10/2015  . Threatened preterm labor 12/10/2015  . Supervision of high risk pregnancy, antepartum 10/18/2015  . Substance abuse complicating pregnancy, antepartum 10/18/2015  . History of gestational diabetes in prior pregnancy, currently pregnant in third trimester 10/18/2015  . Seizure disorder in pregnancy, antepartum (HCC) 10/18/2015  . H/O postpartum depression, currently pregnant 10/18/2015  . Cocaine abuse with cocaine-induced mood disorder (HCC) 06/13/2015  . MDD (major depressive disorder), recurrent, severe, with psychosis (HCC) 06/12/2015  . Severe  recurrent major depression without psychotic features (HCC) 03/29/2015  . PTSD (post-traumatic stress disorder) 03/26/2015  . Hallucinations 03/25/2015  . Suicidal ideations 03/25/2015  . Suicidal ideation 03/24/2015    Assessment / Plan: 25 y.o. [id number] at 4378w5d here for PPROM. Continue Vanc and Azithro  Labor: latent phase, limit cervical checks in the setting of PPROM. Start pitocin per discussion with Dr. Penne LashLeggett.  Fetal Wellbeing:  Cat 1 Pain Control:  None needed at this time, Fentanyl IV and epidural upon request ordered. Anticipated MOD:  SVD  Federico FlakeKimberly Niles Newton, MD  12/11/2015, 6:16 AM

## 2015-12-11 NOTE — Progress Notes (Signed)
LABOR PROGRESS NOTE  Mariah Mccarthy is a 25 y.o. [id number] at 5466w5d  admitted for PPROM  Subjective: Patient reports that she is feeling well.  Feels contractions but not as intense as earlier.  We discussed her lab results.    Objective: BP 92/45 mmHg  Pulse 83  Temp(Src) 98.4 F (36.9 C) (Oral)  Resp 16  Ht 5\' 7"  (1.702 m)  Wt 168 lb (76.204 kg)  BMI 26.31 kg/m2  SpO2 98%  LMP 04/12/2015 or  Filed Vitals:   12/10/15 2048 12/10/15 2220 12/10/15 2258 12/11/15 0027  BP: 120/54 117/65  92/45  Pulse: 94 89 89 83  Temp: 98.8 F (37.1 C) 98.3 F (36.8 C) 98.2 F (36.8 C) 98.4 F (36.9 C)  TempSrc: Oral Oral Oral Oral  Resp: 16 16    Height: 5\' 6"  (1.676 m)  5\' 7"  (1.702 m)   Weight: 168 lb (76.204 kg)  168 lb (76.204 kg)   SpO2: 99%  98%    Gen: awake, alert, well appearing female, NAD Pulm: breathing normally on RA Psych: pleasant, normal speech, mood stable  Fetal monitoringBaseline: 130 bpm, Variability: Good {> 6 bpm), Accelerations: Reactive and Decelerations: Absent Uterine activityFrequency: Every 5 minutes   Dilation: 3 Effacement (%): 50, 40 Station: -3 Presentation: Vertex Exam by:: K. WeissRN  Labs: Lab Results  Component Value Date   WBC 10.7* 12/10/2015   HGB 10.7* 12/10/2015   HCT 32.1* 12/10/2015   MCV 86.1 12/10/2015   PLT 244 12/10/2015    Patient Active Problem List   Diagnosis Date Noted  . Preterm premature rupture of membranes (PPROM) with onset of labor within 24 hours of rupture in third trimester, antepartum 12/10/2015  . Threatened preterm labor 12/10/2015  . Supervision of high risk pregnancy, antepartum 10/18/2015  . Substance abuse complicating pregnancy, antepartum 10/18/2015  . History of gestational diabetes in prior pregnancy, currently pregnant in third trimester 10/18/2015  . Seizure disorder in pregnancy, antepartum (HCC) 10/18/2015  . H/O postpartum depression, currently pregnant 10/18/2015  . Cocaine abuse with  cocaine-induced mood disorder (HCC) 06/13/2015  . MDD (major depressive disorder), recurrent, severe, with psychosis (HCC) 06/12/2015  . Severe recurrent major depression without psychotic features (HCC) 03/29/2015  . PTSD (post-traumatic stress disorder) 03/26/2015  . Hallucinations 03/25/2015  . Suicidal ideations 03/25/2015  . Suicidal ideation 03/24/2015    Assessment / Plan: 25 y.o. [id number] at 2866w5d here for PPROM. Continue Vanc and Azithro  Labor: latent phase, limit cervical checks in the setting of PPROM. S/p BMZ.  Second dose scheduled for 2300. Fetal Wellbeing:  Cat 1 Pain Control:  None needed at this time Anticipated MOD:  SVD  Ashly Gottschalk, DO 12/11/2015, 2:10 AM

## 2015-12-11 NOTE — Progress Notes (Addendum)
LABOR PROGRESS NOTE  Mariah Mccarthy is a 25 y.o. [id number] at 3796w5d  admitted for PPROM  Subjective: Patient reports that she is feeling more pressure/ pain.  Though not ready for pain medication just yet.  Objective: BP 114/63 mmHg  Pulse 83  Temp(Src) 98.7 F (37.1 C) (Oral)  Resp 18  Ht 5\' 7"  (1.702 m)  Wt 168 lb (76.204 kg)  BMI 26.31 kg/m2  SpO2 98%  LMP 04/12/2015 or  Filed Vitals:   12/11/15 0027 12/11/15 0200 12/11/15 0219 12/11/15 0534  BP: 92/45  95/47 114/63  Pulse: 83  80 83  Temp: 98.4 F (36.9 C) 98.7 F (37.1 C)    TempSrc: Oral Oral    Resp:   18 18  Height:      Weight:      SpO2:       Gen: awake, alert, well appearing female, NAD Pulm: breathing normally on RA Psych: pleasant, normal speech, mood stable  Fetal monitoringBaseline: 140 bpm, Variability: Good {> 6 bpm), Accelerations: Reactive and Decelerations: Absent Uterine activityFrequency: Every 3-6 minutes   Dilation: 3 Effacement (%): 50 Station: -3 Presentation: Vertex Exam by:: Dr. Nadine CountsGottschalk  Labs: Lab Results  Component Value Date   WBC 10.7* 12/10/2015   HGB 10.7* 12/10/2015   HCT 32.1* 12/10/2015   MCV 86.1 12/10/2015   PLT 244 12/10/2015    Patient Active Problem List   Diagnosis Date Noted  . Preterm premature rupture of membranes (PPROM) with onset of labor within 24 hours of rupture in third trimester, antepartum 12/10/2015  . Threatened preterm labor 12/10/2015  . Supervision of high risk pregnancy, antepartum 10/18/2015  . Substance abuse complicating pregnancy, antepartum 10/18/2015  . History of gestational diabetes in prior pregnancy, currently pregnant in third trimester 10/18/2015  . Seizure disorder in pregnancy, antepartum (HCC) 10/18/2015  . H/O postpartum depression, currently pregnant 10/18/2015  . Cocaine abuse with cocaine-induced mood disorder (HCC) 06/13/2015  . MDD (major depressive disorder), recurrent, severe, with psychosis (HCC) 06/12/2015  . Severe  recurrent major depression without psychotic features (HCC) 03/29/2015  . PTSD (post-traumatic stress disorder) 03/26/2015  . Hallucinations 03/25/2015  . Suicidal ideations 03/25/2015  . Suicidal ideation 03/24/2015    Assessment / Plan: 25 y.o. [id number] at 8796w5d here for PPROM. Continue Vanc and Azithro  Labor: latent phase, limit cervical checks in the setting of PPROM Fetal Wellbeing:  Cat 1 Pain Control:  None needed at this time, Fentanyl IV and epidural upon request ordered. Anticipated MOD:  SVD  Delynn FlavinAshly Bronda Alfred, DO 12/11/2015, 5:52 AM

## 2015-12-11 NOTE — Progress Notes (Signed)
Mariah Mccarthy is a 25 y.o. [id number] at 1631w5d by ultrasound admitted for PPROM.  Subjective: States she is doing okay. Her pain was well controlled with Nitrous, but her tank ran out and we did not have any additional Nitrous tanks.  Objective: BP 98/53 mmHg  Pulse 83  Temp(Src) 97.5 F (36.4 C) (Oral)  Resp 17  Ht 5\' 7"  (1.702 m)  Wt 168 lb (76.204 kg)  BMI 26.31 kg/m2  SpO2 100%  LMP 04/12/2015     FHT:  FHR: 135 bpm, variability: moderate,  accelerations:  Present,  decelerations:  Absent UC:   regular, every 2-3 minutes SVE:   Dilation: 3.5 Effacement (%): 60 Station: -2 Exam by:: Illene BolusLori Clemmons, CNM  Labs: Lab Results  Component Value Date   WBC 10.7* 12/10/2015   HGB 10.7* 12/10/2015   HCT 32.1* 12/10/2015   MCV 86.1 12/10/2015   PLT 244 12/10/2015    Assessment / Plan: Augmentation of labor, progressing slowly  Labor: Progressing slowly on Pitocin; IUPC placed Preeclampsia:  no signs or symptoms of toxicity Fetal Wellbeing:  Category I Pain Control:  Was on Nitrous but we ran out; IV Fentanyl not helping; tried Nubain x 1 I/D:  GBS negative Anticipated MOD:  NSVD  Jinny BlossomKaty D Aleesa Sweigert 12/11/2015, 5:44 PM

## 2015-12-11 NOTE — Progress Notes (Signed)
Mariah BanisterDaveda Mccarthy is a 25 y.o. [id number] at 4113w5d  Subjective: Patient having discomfort - has had fentanyl and nubain, which are minimally helpful.  Objective: BP 98/55 mmHg  Pulse 82  Temp(Src) 98 F (36.7 C) (Oral)  Resp 17  Ht 5\' 7"  (1.702 m)  Wt 168 lb (76.204 kg)  BMI 26.31 kg/m2  SpO2 100%  LMP 04/12/2015      FHT:  FHR: 130s bpm, variability: moderate,  accelerations:  Abscent,  decelerations:  Absent UC:   regular, every 2-3 minutes SVE:   Dilation: 3.5 Effacement (%): 60 Station: -2 Exam by:: Adrian BlackwaterStinson, MD  Labs: Lab Results  Component Value Date   WBC 10.7* 12/10/2015   HGB 10.7* 12/10/2015   HCT 32.1* 12/10/2015   MCV 86.1 12/10/2015   PLT 244 12/10/2015    Assessment / Plan: Protracted latent phase  Labor: No progress on pitocin.  Cervix still firm - will stop pitocin and change to cytotec after pitocin cleared. Preeclampsia:  no signs or symptoms of toxicity Fetal Wellbeing:  Category I Pain Control:  IV pain meds I/D:  n/a Anticipated MOD:  NSVD  Odaly Peri JEHIEL 12/11/2015, 7:18 PM

## 2015-12-12 ENCOUNTER — Encounter (HOSPITAL_COMMUNITY): Payer: Self-pay

## 2015-12-12 DIAGNOSIS — Z3A34 34 weeks gestation of pregnancy: Secondary | ICD-10-CM

## 2015-12-12 DIAGNOSIS — F1490 Cocaine use, unspecified, uncomplicated: Secondary | ICD-10-CM

## 2015-12-12 DIAGNOSIS — F17210 Nicotine dependence, cigarettes, uncomplicated: Secondary | ICD-10-CM

## 2015-12-12 DIAGNOSIS — F1290 Cannabis use, unspecified, uncomplicated: Secondary | ICD-10-CM

## 2015-12-12 DIAGNOSIS — O42113 Preterm premature rupture of membranes, onset of labor more than 24 hours following rupture, third trimester: Secondary | ICD-10-CM

## 2015-12-12 DIAGNOSIS — O99334 Smoking (tobacco) complicating childbirth: Secondary | ICD-10-CM

## 2015-12-12 DIAGNOSIS — O99324 Drug use complicating childbirth: Secondary | ICD-10-CM

## 2015-12-12 MED ORDER — DIBUCAINE 1 % RE OINT: 1.0000 "application " | TOPICAL_OINTMENT | RECTAL | Status: DC | PRN

## 2015-12-12 MED ORDER — SENNOSIDES-DOCUSATE SODIUM 8.6-50 MG PO TABS
2.0000 | ORAL_TABLET | ORAL | Status: DC
  Filled 2015-12-12: qty 2

## 2015-12-12 MED ORDER — TERBUTALINE SULFATE 1 MG/ML IJ SOLN
0.2500 mg | Freq: Once | INTRAMUSCULAR | Status: AC | PRN
  Administered 2015-12-12: 0.25 mg via SUBCUTANEOUS
  Filled 2015-12-12: qty 1

## 2015-12-12 MED ORDER — BENZOCAINE-MENTHOL 20-0.5 % EX AERO: 1.0000 "application " | INHALATION_SPRAY | CUTANEOUS | Status: DC | PRN

## 2015-12-12 MED ORDER — LACTATED RINGERS IV SOLN: 1.0000 m[IU]/min | INTRAVENOUS | Status: DC

## 2015-12-12 MED ORDER — IBUPROFEN 600 MG PO TABS
600.0000 mg | ORAL_TABLET | Freq: Four times a day (QID) | ORAL | Status: DC
  Administered 2015-12-12 – 2015-12-14 (×6): 600 mg via ORAL
  Filled 2015-12-12 (×6): qty 1

## 2015-12-12 MED ORDER — ZOLPIDEM TARTRATE 5 MG PO TABS
5.0000 mg | ORAL_TABLET | Freq: Every evening | ORAL | Status: DC | PRN
Start: 2015-12-12 — End: 2015-12-14

## 2015-12-12 MED ORDER — ONDANSETRON HCL 4 MG PO TABS: 4.0000 mg | ORAL_TABLET | ORAL | Status: DC | PRN

## 2015-12-12 MED ORDER — WITCH HAZEL-GLYCERIN EX PADS: 1.0000 "application " | MEDICATED_PAD | CUTANEOUS | Status: DC | PRN

## 2015-12-12 MED ORDER — TERBUTALINE SULFATE 1 MG/ML IJ SOLN: 0.2500 mg | Freq: Once | INTRAMUSCULAR | Status: DC | PRN

## 2015-12-12 MED ORDER — MISOPROSTOL 200 MCG PO TABS
ORAL_TABLET | ORAL | Status: AC
  Administered 2015-12-12: 600 ug
  Filled 2015-12-12: qty 3

## 2015-12-12 MED ORDER — COCONUT OIL OIL
1.0000 "application " | TOPICAL_OIL | Status: DC | PRN
  Filled 2015-12-12: qty 120

## 2015-12-12 MED ORDER — ACETAMINOPHEN 325 MG PO TABS: 650.0000 mg | ORAL_TABLET | ORAL | Status: DC | PRN

## 2015-12-12 MED ORDER — AZITHROMYCIN 500 MG PO TABS
1000.0000 mg | ORAL_TABLET | Freq: Once | ORAL | Status: AC
  Administered 2015-12-12: 1000 mg via ORAL
  Filled 2015-12-12: qty 2

## 2015-12-12 MED ORDER — PRENATAL MULTIVITAMIN CH
1.0000 | ORAL_TABLET | Freq: Every day | ORAL | Status: DC
  Administered 2015-12-13: 1 via ORAL
  Filled 2015-12-12: qty 1

## 2015-12-12 MED ORDER — TETANUS-DIPHTH-ACELL PERTUSSIS 5-2.5-18.5 LF-MCG/0.5 IM SUSP: 0.5000 mL | Freq: Once | INTRAMUSCULAR | Status: DC

## 2015-12-12 MED ORDER — SIMETHICONE 80 MG PO CHEW: 80.0000 mg | CHEWABLE_TABLET | ORAL | Status: DC | PRN

## 2015-12-12 MED ORDER — LACTATED RINGERS IV SOLN
1.0000 m[IU]/min | INTRAVENOUS | Status: DC
  Administered 2015-12-12: 2 m[IU]/min via INTRAVENOUS

## 2015-12-12 MED ORDER — ONDANSETRON HCL 4 MG/2ML IJ SOLN: 4.0000 mg | INTRAMUSCULAR | Status: DC | PRN

## 2015-12-12 MED ORDER — DIPHENHYDRAMINE HCL 25 MG PO CAPS: 25.0000 mg | ORAL_CAPSULE | Freq: Four times a day (QID) | ORAL | Status: DC | PRN

## 2015-12-12 NOTE — Progress Notes (Signed)
Mariah Mccarthy is a 25 y.o. [id number] at 1456w6d by LMP admitted for induction of labor due to Spontaneous rupture of BOW.  Subjective: pt jhas progressed slowly, has been at 5cm most of day. IUPC in place, with contractions q 3 mins.  MVU's 180-300 thru the day.   Objective: BP 106/92 mmHg  Pulse 88  Temp(Src) 98.5 F (36.9 C) (Oral)  Resp 18  Ht 5\' 7"  (1.702 m)  Wt 168 lb (76.204 kg)  BMI 26.31 kg/m2  SpO2 99%  LMP 04/12/2015        FHT:  FHR: 150 bpm, variability: minimal ,  accelerations:  Present,  decelerations:  Absent UC:   regular, every 3 minutes SVE:   Dilation: 5 Effacement (%): 40 Station: -2 Exam by:: Dr Emelda FearFerguson there is a small forebag present, despite documented rom and IUPC. Forebag ruptured with clear fluid without malodor.  Labs: Lab Results  Component Value Date   WBC 10.7* 12/10/2015   HGB 10.7* 12/10/2015   HCT 32.1* 12/10/2015   MCV 86.1 12/10/2015   PLT 244 12/10/2015    Assessment / Plan: Induction of labor due to PROM,  progressing well on pitocin  Labor: Progressing normally Preeclampsia:   Fetal Wellbeing:  Category I Pain Control:  IV pain meds and fentanyl I/D:  n/a Anticipated MOD:  NSVD  Mariah Mccarthy V 12/12/2015, 6:52 PM

## 2015-12-12 NOTE — Progress Notes (Signed)
Abdominal binder placed on pt's abdomen

## 2015-12-12 NOTE — Consult Note (Signed)
Neonatology Note:   Attendance at Delivery:   I was asked by E. Poore for Dr. Pratt to attend this NSVD at 34 6/7 weeks after PPROM and onset of PTL. The mother is a G7P4A2 A pos, GBS neg with drug use during the pregnancy (smoked 1 pack/day, THC and cocaine use as recently as the past week, UDS pos for cocaine). She had dates per a 6 week ultrasound, but had gotten very little PNC since then. She has bipolar disorder, schizophrenia, and has a history of multiple suicide attempts. She has been incarcerated during the pregnancy. She is on Prozac and got Betamethasone 4/10-11. She has been on Vancomycin as she is allergic to Penicillin, until her GBS status came back negative, and was afebrile during labor. She tested positive for Chlamydia today and had only gotten 1 dose of Azithromicin prior to delivery. She also had gotten several doses of IV Fentanyl today for pain. ROM 46 hours prior to delivery, fluid clear. Infant vigorous with good spontaneous cry and tone. Delayed cord clamping was done. Needed only minimal bulb suctioning. Ap 8/9. Lungs clear with slightly decreased air exchange to ausc in DR. Pulse oximeter placed, O2 saturations 73% in room air at 6-7 minutes, so BBO2 was given for about 4-5 minutes, then withdrawn, with baby maintaining good O2 saturations after that. She was shown to her mother, then taken to NICU for further care due to prematurity and risk for sepsis.  Segundo Makela C. Johny Pitstick, MD 

## 2015-12-12 NOTE — Progress Notes (Signed)
Mariah Mccarthy is a 25 y.o. [id number] at 7515w6d by ultrasound admitted for PPROM.  Subjective: Pt states she is doing fine. She rates her contractions as an 8.5/10. We discussed that she tested positive for Chlamydia and Pt voiced understanding.  Objective: BP 118/68 mmHg  Pulse 87  Temp(Src) 98.5 F (36.9 C) (Oral)  Resp 16  Ht 5\' 7"  (1.702 m)  Wt 168 lb (76.204 kg)  BMI 26.31 kg/m2  SpO2 99%  LMP 04/12/2015     FHT:  FHR: 135 bpm, variability: moderate,  accelerations:  Present,  decelerations:  Absent UC:   regular, every 2 minutes SVE:   Dilation: 4.5 Effacement (%): 60 Station: -2 Exam by:: Mariah Mccarthy  Labs: Lab Results  Component Value Date   WBC 10.7* 12/10/2015   HGB 10.7* 12/10/2015   HCT 32.1* 12/10/2015   MCV 86.1 12/10/2015   PLT 244 12/10/2015    Assessment / Plan: PPROM on 4/10, labor progressing slowly  Labor: s/p cytotec, on Pitocin  Preeclampsia:  no signs or symptoms of toxicity Fetal Wellbeing:  Category I Pain Control:  IV pain meds I/D:  GBS negative Anticipated MOD:  NSVD  Mariah Mccarthy 12/12/2015, 5:05 PM

## 2015-12-13 LAB — CBC
HEMATOCRIT: 27.2 % — AB (ref 36.0–46.0)
HEMOGLOBIN: 8.9 g/dL — AB (ref 12.0–15.0)
MCH: 28.3 pg (ref 26.0–34.0)
MCHC: 32.7 g/dL (ref 30.0–36.0)
MCV: 86.6 fL (ref 78.0–100.0)
Platelets: 207 10*3/uL (ref 150–400)
RBC: 3.14 MIL/uL — ABNORMAL LOW (ref 3.87–5.11)
RDW: 14.6 % (ref 11.5–15.5)
WBC: 17 10*3/uL — ABNORMAL HIGH (ref 4.0–10.5)

## 2015-12-13 MED ORDER — FERROUS SULFATE 325 (65 FE) MG PO TABS
325.0000 mg | ORAL_TABLET | Freq: Two times a day (BID) | ORAL | Status: DC
  Administered 2015-12-13 – 2015-12-14 (×3): 325 mg via ORAL
  Filled 2015-12-13 (×3): qty 1
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
## 2015-12-13 NOTE — Progress Notes (Signed)
POSTPARTUM PROGRESS NOTE  Post Partum Day 1 Subjective:  Mariah Mccarthy is a 25 y.o. [id number] 442w6d s/p nsvd after pprom.  No acute events overnight.  Pt denies problems with ambulating, voiding or po intake.  She denies nausea or vomiting.  Pain is well controlled.  She has had flatus. She has not had bowel movement.  Lochia Small.   Objective: Blood pressure 114/61, pulse 78, temperature 98.5 F (36.9 C), temperature source Oral, resp. rate 16, height 5\' 7"  (1.702 m), weight 168 lb (76.204 kg), last menstrual period 04/12/2015, SpO2 100 %, unknown if currently breastfeeding.  Physical Exam:  General: alert, cooperative and no distress Lochia:normal flow Chest: CTAB Heart: RRR no m/r/g Abdomen: +BS, soft, nontender,  Uterine Fundus: firm,  DVT Evaluation: No calf swelling or tenderness Extremities: trace edema   Recent Labs  12/10/15 2215 12/13/15 0522  HGB 10.7* 8.9*  HCT 32.1* 27.2*    Assessment/Plan:  ASSESSMENT: Mariah Mccarthy is a 25 y.o. [id number] 822w6d s/p nsvd, doing well. pph immediately after delivery, 800 ebl, bleeding now appropriate, h drop to 8.9, will start feso4. sw consult dtoday (polysubstance, mental health)  Plan for discharge tomorrow   LOS: 3 days   Silvano Bilisoah B Lark Langenfeld 12/13/2015, 7:48 AM

## 2015-12-14 MED ORDER — ACETAMINOPHEN 325 MG PO TABS: 650.0000 mg | ORAL_TABLET | ORAL | Status: AC | PRN

## 2015-12-14 MED ORDER — FERROUS SULFATE 325 (65 FE) MG PO TABS: 325.0000 mg | ORAL_TABLET | Freq: Two times a day (BID) | ORAL | Status: AC

## 2015-12-14 MED ORDER — IBUPROFEN 600 MG PO TABS: 600.0000 mg | ORAL_TABLET | Freq: Four times a day (QID) | ORAL | Status: AC

## 2015-12-14 MED ORDER — MEDROXYPROGESTERONE ACETATE 150 MG/ML IM SUSP
150.0000 mg | Freq: Once | INTRAMUSCULAR | Status: AC
  Administered 2015-12-14: 150 mg via INTRAMUSCULAR
  Filled 2015-12-14: qty 1

## 2015-12-14 NOTE — Discharge Summary (Signed)
OB Discharge Summary     Patient Name: Mariah Mccarthy DOB: 1991-05-15 MRN: [id number]  Date of admission: 12/10/2015 Delivering MD: Ulanda Edison A   Date of discharge: 12/14/2015  Admitting diagnosis: 39w ctx, cramps Intrauterine pregnancy: [redacted]w[redacted]d     Secondary diagnosis:  Principal Problem:   NSVD (normal spontaneous vaginal delivery) Active Problems:   Severe recurrent major depression without psychotic features (HCC)   Supervision of high risk pregnancy, antepartum   Substance abuse complicating pregnancy, antepartum   History of gestational diabetes in prior pregnancy, currently pregnant in third trimester   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 yo [id number] at [redacted]w[redacted]d was admitted with pprom on 12/10/2015. Patient received betamethasone x2. Given vancomycin for gbs unknown w/ history type 1 reaction to penicillins. Chlamydia positive, treated. Augmented with pitocin and cytotec.  Membrane Rupture Time/Date: 9:30 PM ,12/10/2015   Intrapartum Procedures: Episiotomy: None [1]                                         Lacerations:  None [1]  Patient had a delivery of a Viable infant. 12/12/2015  Information for the patient's newborn:  Huberta, Tompkins [[id number]]  Delivery Method: Vaginal, Spontaneous Delivery (Filed from Delivery Summary)   Limited prenatal care, polysubstance abuse, multiple psychiatric problems. Seen by SW, OK for d/c. We  strongly recommended OB f/u.  Pateint had an uncomplicated postpartum course.  She is ambulating, tolerating a regular diet, passing flatus, and urinating well. Patient is discharged home in stable condition on 12/14/2015.    Physical exam  Filed Vitals:   12/13/15 1030 12/13/15 1914 12/13/15 2304 12/14/15 0535  BP: 107/61 117/78 120/63 110/65  Pulse: 76 75 89 78  Temp: 98.9 F (37.2 C) 98.6 F (37 C) 98.4 F (36.9 C) 98.2 F (36.8 C)  TempSrc: Oral Oral Oral Oral  Resp: Height:      Weight:      SpO2: 100% 100% 99% 100%   General: alert, cooperative and no distress Lochia: appropriate Uterine Fundus: firm Incision: N/A DVT Evaluation: No cords or calf tenderness. No significant calf/ankle edema. Labs: Lab Results  Component Value Date   WBC 17.0* 12/13/2015   HGB 8.9* 12/13/2015   HCT 27.2* 12/13/2015   MCV 86.6 12/13/2015   PLT 207 12/13/2015   CMP Latest Ref Rng 06/11/2015  Glucose 65 - 99 mg/dL 130(Q)  BUN  6 - 20 mg/dL 8  Creatinine 1.610.44 - 0.961.00 mg/dL 0.450.72  Sodium 409135 - 811145 mmol/L 135  Potassium 3.5 - 5.1 mmol/L 3.2(L)  Chloride 101 - 111 mmol/L 101  CO2 22 - 32 mmol/L 24  Calcium 8.9 - 10.3 mg/dL 9.1  Total Protein 6.5 - 8.1 g/dL 7.0  Total Bilirubin 0.3 - 1.2 mg/dL 0.4  Alkaline Phos 38 - 126 U/L 63  AST 15 - 41 U/L 23  ALT 14 - 54 U/L 20    Discharge instruction: per After Visit Summary and "Baby and Me Booklet".  After visit meds:    Medication List    TAKE these medications        acetaminophen 325 MG tablet  Commonly known as:  TYLENOL  Take 2 tablets (650 mg total) by mouth every 4 (four) hours as needed (for pain scale < 4).     ferrous sulfate 325 (65 FE) MG tablet  Take 1 tablet (325 mg total) by mouth 2 (two) times daily with a meal.     FLUoxetine 10 MG capsule  Commonly known as:  PROZAC  Take 1 capsule (10 mg total) by mouth daily.     ibuprofen 600 MG tablet  Commonly known as:  ADVIL,MOTRIN  Take 1 tablet (600  mg total) by mouth every 6 (six) hours.        Diet: routine diet  Activity: Advance as tolerated. Pelvic rest for 6 weeks.   Outpatient follow up:6 weeks Follow up Appt:No future appointments. Follow up Visit:No Follow-up on file.  Postpartum contraception: Depo Provera, to be given prior to d/c  Newborn Data: Live born female  Birth Weight: 5 lb 7.1 oz (2470 g) APGAR: 8, 9  Baby Feeding: Bottle Disposition:NICU   12/14/2015 Silvano BilisNoah B Luevenia Mcavoy, MD

## 2015-12-14 NOTE — Discharge Instructions (Signed)

## 2015-12-14 NOTE — Progress Notes (Signed)
Pt verbalizes understanding of d/c instructions, medications, follow up appts, when to seek medical attention, and belongings policy. Pt has no questions at this time. Pts IV was d/c previously. Pt is finishing breakfast and states that she is ready to leave to go to NICU to visit with baby until her fried arrives to take her home. Offered to help pt with any belongings. She only has a small bag which she took with her to NICU. She states that she still has a paper from the birth registrar that was never picked up. I called Air cabin crewBirth Registrar and left a message. I advised pt to notify the baby's nurse in NICU and told her where the Birth Registrar's office is located and advised her to stop by there on her way out today.  Sheryn BisonGordon, Sharina Petre Warner

## 2016-01-22 ENCOUNTER — Telehealth: Payer: Self-pay | Admitting: Family Medicine

## 2016-01-22 NOTE — Telephone Encounter (Signed)
Called patient about her appointment change. Woman answered the phone stated this was not her number, but she could give her a message. Left message for patient to call about appointment information.

## 2016-01-24 ENCOUNTER — Ambulatory Visit: Payer: Self-pay | Admitting: Obstetrics and Gynecology

## 2016-02-13 ENCOUNTER — Ambulatory Visit: Payer: Self-pay | Admitting: Obstetrics and Gynecology
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
## 2017-03-01 IMAGING — US US OB COMP LESS 14 WK
1 series · 15 of 28 positions shown · non-contrast
Comparison: None.

CLINICAL DATA: Abdominal pain in pregnancy.

EXAM:
OBSTETRIC <14 WK US AND TRANSVAGINAL OB US
TECHNIQUE: Both transabdominal and transvaginal ultrasound examinations were
performed for complete evaluation of the gestation as well as the
maternal uterus, adnexal regions, and pelvic cul-de-sac.
Transvaginal technique was performed to assess early pregnancy.

[Series 1: us ob comp less 14 wk · 15 of 60 slices shown]
[im 1/60]
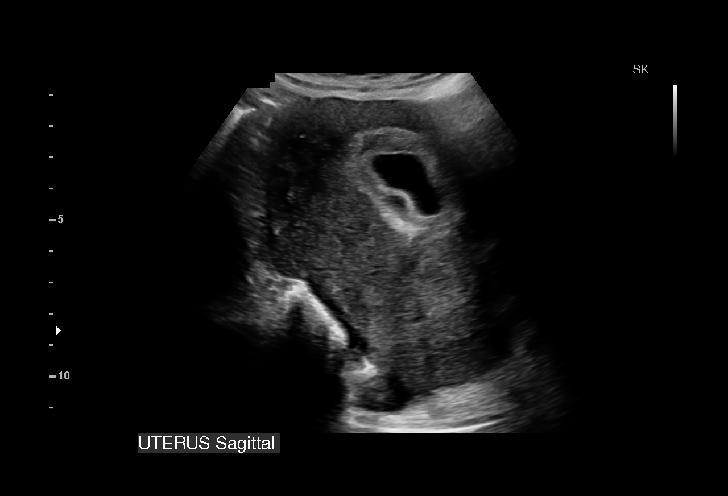
[im 5/60]
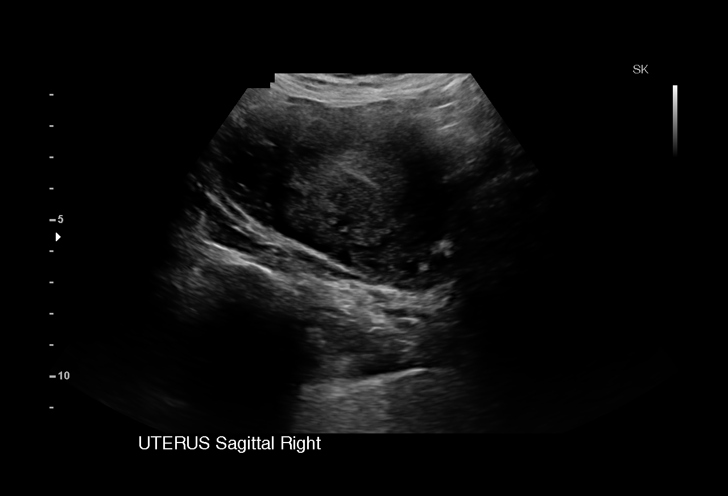
[im 9/60]
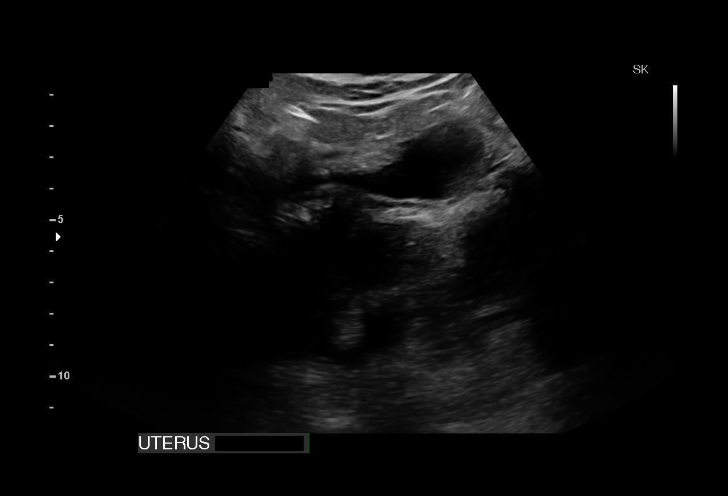
[im 14/60]
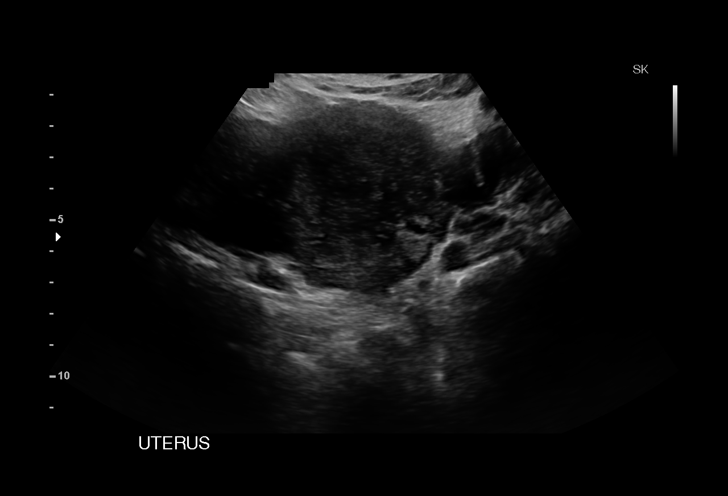
[im 18/60]
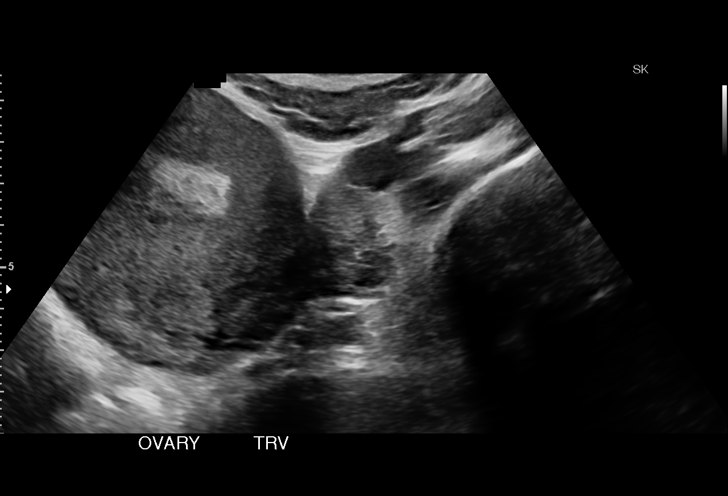
[im 22/60]
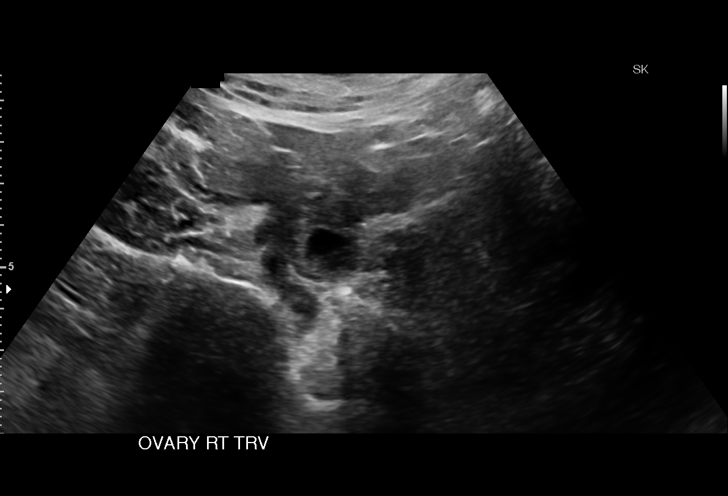
[im 27/60]
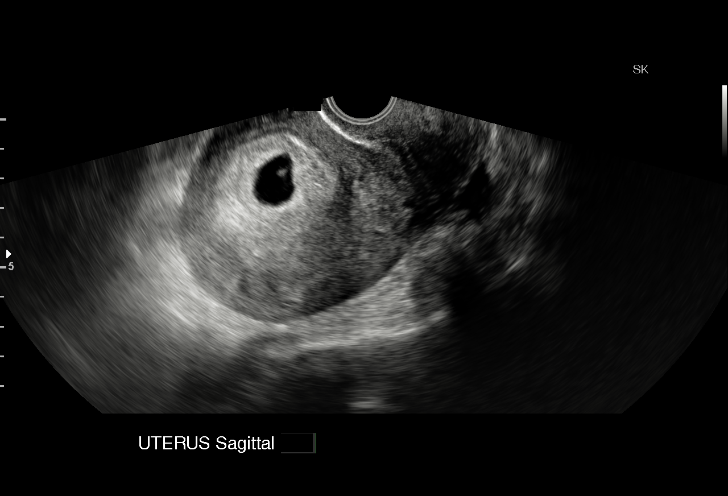
[im 31/60]
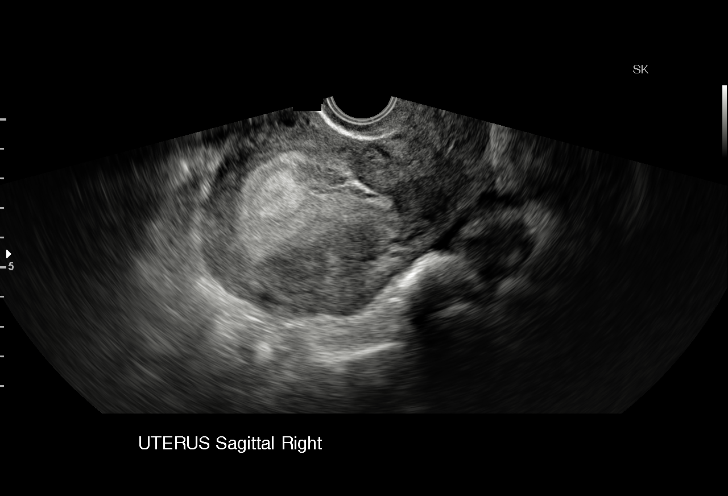
[im 33/60]
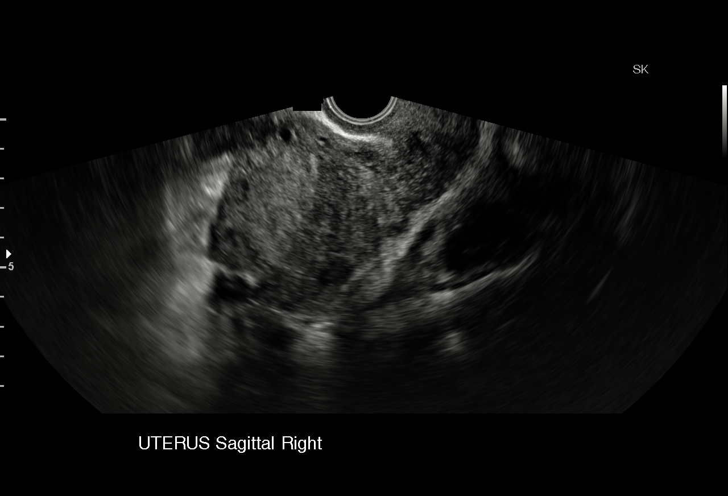
[im 38/60]
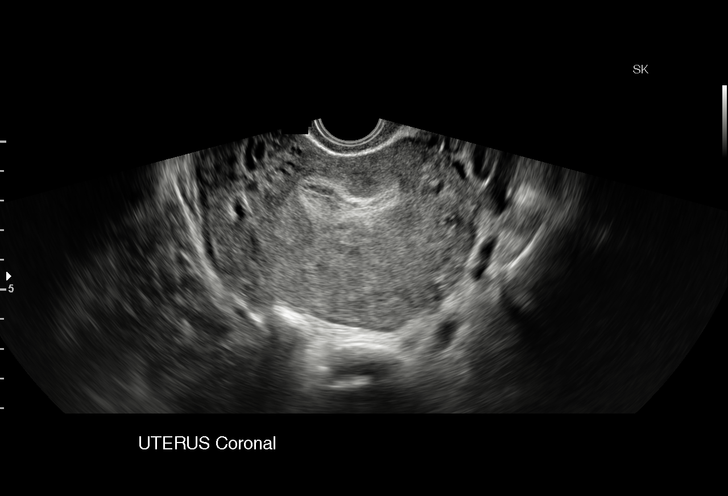
[im 42/60]
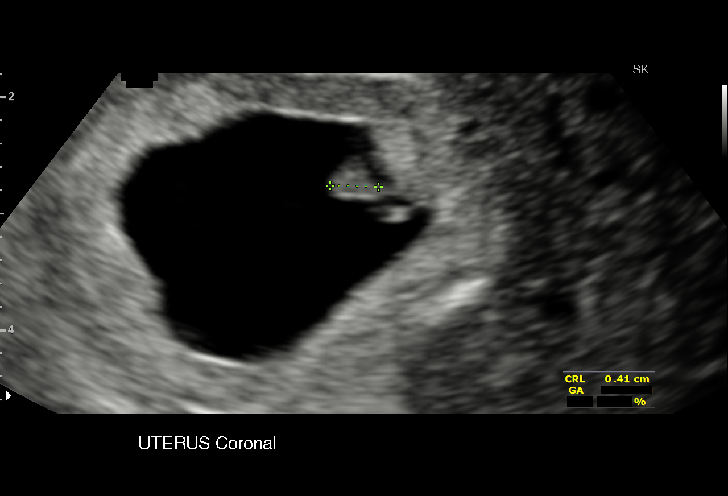
[im 46/60]
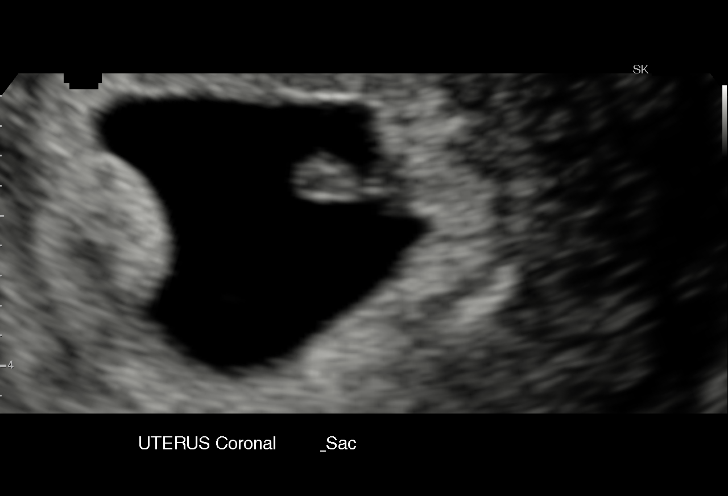
[im 51/60]
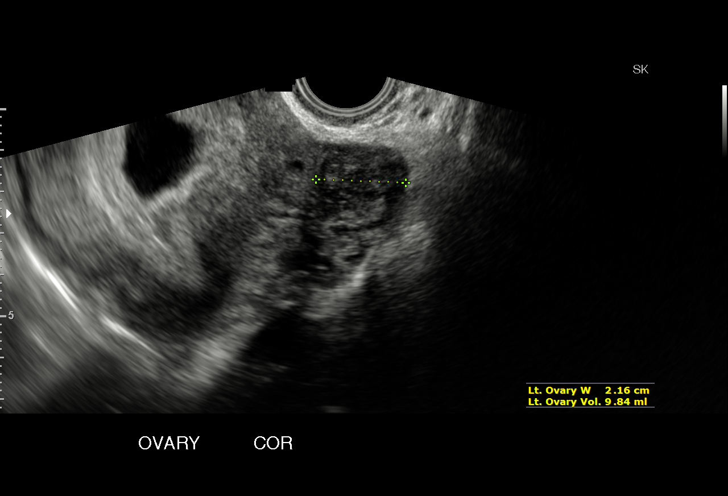
[im 55/60]
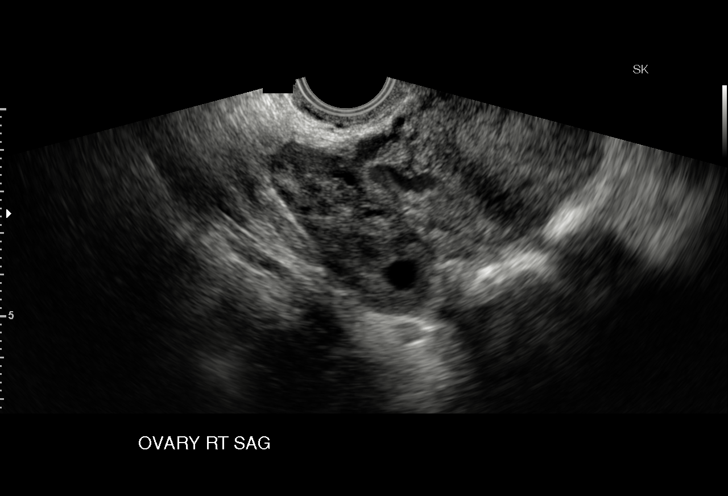
[im 60/60]
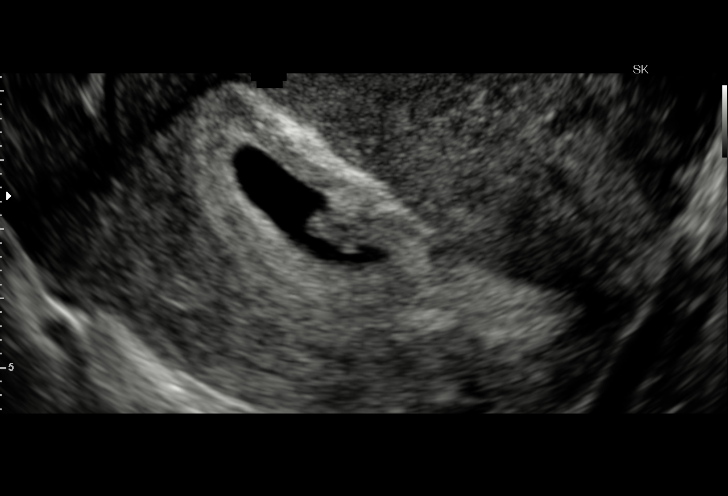

[15 of 28 positions shown; findings below may reference images not displayed]

FINDINGS: Intrauterine gestational sac: Visualized/normal in shape.

Yolk sac:  Present

Embryo:  Present

Cardiac Activity: Present

Heart Rate: 109  bpm

CRL:  4.1  mm   6 w   1 d                  US EDC: 01/22/2016

Maternal uterus/adnexae: No adnexal mass. No subchorionic
hemorrhage. Normal bilateral ovaries. No pelvic free fluid.
IMPRESSION: Single live intrauterine pregnancy dating 6 weeks 1 day.

## 2019-05-30 ENCOUNTER — Emergency Department: Admit: 2019-05-31 | Payer: PRIVATE HEALTH INSURANCE

## 2019-05-30 ENCOUNTER — Inpatient Hospital Stay
Admit: 2019-05-30 | Discharge: 2019-05-31 | Disposition: A | Discharge status: Home / Self Care | Payer: PRIVATE HEALTH INSURANCE

## 2019-05-30 DIAGNOSIS — S0990XA Unspecified injury of head, initial encounter: Secondary | ICD-10-CM

## 2019-05-30 NOTE — ED Notes (Signed)
Patient states at 8 am she was showering when the ceiling tile fell on her head causing her to fall to the ground. States she is unsure if she had loc. Complains of neck and right shoulder pain

## 2019-05-30 NOTE — ED Triage Notes (Signed)
Patient states at 8 am she was showering when the ceiling tile fell on her head causing her to fall to the ground. States she is unsure if she had loc. Complains of neck and right shoulder pain

## 2019-05-31 LAB — HCG URINE, QL
HCG urine, QL: NEGATIVE
Pregnancy Test(Urn): NEGATIVE

## 2019-05-31 MED ORDER — CYCLOBENZAPRINE 10 MG TAB
10 mg | ORAL_TABLET | Freq: Three times a day (TID) | ORAL | 0 refills | Status: AC | PRN
Start: 2019-05-31 — End: 2019-06-05

## 2019-05-31 MED ORDER — CYCLOBENZAPRINE 10 MG TAB
10 mg | ORAL | Status: AC
Start: 2019-05-31 — End: 2019-05-31
  Administered 2019-05-31: 05:00:00 via ORAL

## 2019-05-31 MED ORDER — ACETAMINOPHEN 500 MG TAB
500 mg | ORAL | Status: AC
Start: 2019-05-31 — End: 2019-05-31
  Administered 2019-05-31: 05:00:00 via ORAL

## 2019-05-31 MED FILL — TYLENOL EXTRA STRENGTH 500 MG TABLET: 500 mg | ORAL | Qty: 2

## 2019-05-31 MED FILL — CYCLOBENZAPRINE 10 MG TAB: 10 mg | ORAL | Qty: 1

## 2019-05-31 NOTE — ED Provider Notes (Signed)
ED Provider Notes by Evelina BucyPeyton, Ariela Mochizuki S, NP at 05/31/19 [id number]                Author: Evelina BucyPeyton, Aadvik Roker S, NP  Service: EMERGENCY  Author Type: Nurse Practitioner       Filed: 06/05/19 0658  Date of Service: 05/31/19 0052  Status: Attested           Editor: Evelina BucyPeyton, Roxanne Panek S, NP (Nurse Practitioner)  Cosigner: Debbe OdeaLewis, Robert M Jr., MD at 06/10/19 [phone]          Attestation signed by Debbe OdeaLewis, Robert M Jr., MD at 06/10/19 0618          Approved                                    EMERGENCY DEPARTMENT HISTORY AND PHYSICAL EXAM           Date: 05/31/2019   Patient Name: Julia Mack        History of Presenting Illness          Chief Complaint       Patient presents with        ?  Head Injury           History Provided By: Patient      HPI: Julia Mack,  28 y.o. female with No significant  past medical history presents to the ED with cc of head injury.  She reports ceiling tiles fell on her while she was showering today at the hotel she is residing in.  She denies LOC but states she was  knocked to the ground. She is complaining of right neck, shoulder and upper chest pain since.  She denies any dizziness, headache or N/V.  She has not tried taking anything for discomfort      There are no other complaints, changes, or physical findings at this time.      PCP: None        No current facility-administered medications on file prior to encounter.           No current outpatient medications on file prior to encounter.             Past History        Past Medical History:     Past Medical History:        Diagnosis  Date         ?  DVT (deep venous thrombosis) (HCC)             Past Surgical History:   History reviewed. No pertinent surgical history.      Family History:   History reviewed. No pertinent family history.      Social History:     Social History          Tobacco Use         ?  Smoking status:  Former Smoker     ?  Smokeless tobacco:  Never Used       Substance Use Topics         ?  Alcohol use:  Not Currently         ?   Drug use:  Not on file           Allergies:     Allergies        Allergen  Reactions         ?  Latex  Unknown (comments)         ?  Penicillins  Unknown (comments)                Review of Systems        Review of Systems    Constitutional: Negative.  Negative for chills, fatigue and fever.    Respiratory: Negative.  Negative for cough and shortness of breath.     Cardiovascular: Positive for chest pain. Negative for palpitations.    Gastrointestinal: Negative.  Negative for abdominal pain, blood in stool, constipation, diarrhea, nausea and vomiting.    Genitourinary: Negative.  Negative for dysuria and hematuria.    Musculoskeletal: Positive for arthralgias, joint swelling  and neck pain.    Skin: Negative.  Negative for rash.    Neurological: Negative.  Negative for dizziness, speech difficulty, weakness, light-headedness and headaches.    Psychiatric/Behavioral: Negative.  Negative for sleep disturbance and suicidal ideas.    All other systems reviewed and are negative.           Physical Exam        Physical Exam   Vitals signs and nursing note reviewed.   Constitutional:        General: She is not in acute distress.     Appearance: Normal appearance.    HENT:       Head: Normocephalic and atraumatic.   Eyes :       General: Vision grossly intact. Gaze aligned appropriately.      Extraocular Movements: Extraocular movements intact.      Right eye: No nystagmus.      Left eye: No nystagmus.      Conjunctiva/sclera: Conjunctivae normal.       Right eye: No hemorrhage.     Left eye: No hemorrhage.  Neck :       Musculoskeletal: Neck supple. Decreased range of motion. Injury , pain with movement and muscular tenderness  present. No edema, erythema, neck rigidity, crepitus or spinous process tenderness.    Cardiovascular:       Rate and Rhythm: Normal rate and regular rhythm.      Pulses: Normal pulses.      Heart sounds: Normal heart sounds. No murmur.    Pulmonary:       Effort: Pulmonary effort is normal.       Breath sounds: Normal breath sounds. No wheezing or rales.   Chest:       Chest wall: Tenderness present. No lacerations, deformity, swelling or crepitus.   Abdominal:      General: Bowel sounds are normal.      Palpations: Abdomen is  soft.      Tenderness: There is no abdominal tenderness. There is no right CVA tenderness or left CVA tenderness.     Musculoskeletal:      Right shoulder: She exhibits tenderness. She exhibits  normal range of motion, no swelling, no crepitus, no deformity, no laceration and normal strength.      Right lower leg: No edema.      Left lower leg: No edema.    Skin:      General: Skin is warm and dry.      Findings: No rash.    Neurological:       General: No focal deficit present.      Mental Status: She is alert.      GCS: GCS eye subscore is 4. GCS verbal subscore is  5. GCS motor subscore is 6.  Cranial Nerves: Cranial nerves are intact.       Sensory: Sensation is intact.      Motor: Motor function is intact.      Coordination: Coordination is intact.      Gait: Gait is intact.   Psychiatric:         Mood and Affect: Mood normal.         Behavior: Behavior normal. Behavior is cooperative.               Diagnostic Study Results        Labs -       No results found for this or any previous visit (from the past 12 hour(s)).      Radiologic Studies -       XR Results (maximum last 3):     Results from Hospital Encounter encounter on 05/31/19     XR SHOULDER RT AP/LAT MIN 2 V           Narrative  Injury.              Impression  FINDINGS: Impression: 3 views of the right shoulder. No acute fracture or   dislocation. Joints maintained. No significant soft tissue swelling.           CT Results (maximum last 3) :     Results from Hospital Encounter encounter on 05/31/19     CT SPINE CERV WO CONT           Narrative  Injury.      No comparison.      Technique: Axial images cervical spine with sagittal and coronal reformats.   Dose reduction: All CT scans at this facility are performed  using dose reduction   optimization techniques as appropriate to a performed exam including the   following: Automated exposure control, adjustments of the mA and/or kV according   to patient's size, or use of iterative reconstruction technique.      FINDINGS: There is loss of the normal cervical lordosis. No listhesis. Vertebral   body heights and disc spaces are maintained. No acute fracture or facet   malalignment. Lateral pillars symmetric bilaterally. Odontoid is intact. No   prevertebral soft tissue swelling. Lung apices clear.          Impression  IMPRESSION:   1. No acute bony findings.   2. Loss of the normal cervical lordosis can be seen with ligamentous injury or   muscular spasm, or simply be positional.       CT HEAD WO CONT           Narrative  Head injury.      No comparison.      Technique: Axial images  head without IV contrast. Multiplanar reformatting   performed.   Dose reduction: All CT scans at this facility are performed using dose reduction   optimization techniques as appropriate to a performed exam including the   following: Automated exposure control, adjustments of the mA and/or kV according   to patient's size, or use of iterative reconstruction technique.      Findings: No abnormal brain density. No  mass effect, extra-axial fluid   collection, hemorrhage. CSF-containing structures normal size, shape, position.   No   calvarial fractures. Visualized facial sinuses unopacified.              Impression  Impression:    1. No acute intracranial findings.  Medical Decision Making     I am the first provider for this patient.      I reviewed the vital signs, available nursing notes, past medical history, past surgical history, family history and social history.      Vital Signs-Reviewed the patient's vital signs.   No data found.      Records Reviewed: Nursing Notes      The patient presents with head injury with a differential diagnosis of ICH, cervical strain/fx, contusion          Provider Notes (Medical Decision Making):    Discussed findings with pt and normal expectations post traumatic event, discussed RICE; imaging negative, will d/c with muscle relaxer, OTC tylenol/IBU as needed   MDM             ED Course:    Initial assessment performed. The patients presenting problems have been discussed, and they are in agreement with the care plan formulated and outlined with them.  I have encouraged them to ask questions as they arise throughout their visit.                PLAN:   1.      Discharge Medication List as of 05/31/2019  1:49 AM             flexeril 10mg  TID PRN    OTC tylenol    2.      Follow-up Information               Follow up With  Specialties  Details  Why  Contact Info              [facility]  Emergency Medicine    If symptoms worsen  [address]   [place]   [phone]              [phone], MD  Orthopedic Surgery, Sports Medicine    orthopedics for continued pain  [address]   [address]   [place]   [phone]                 [facility]    Schedule an appointment as soon as possible for a visit in 2 days    [address]   [place]   [phone]             Return to ED if worse         Diagnosis        Clinical Impression:       1.  Minor head injury, initial encounter      2.  Contusion of right chest wall, initial encounter         3.  Strain of neck muscle, initial encounter

## 2019-05-31 NOTE — ED Provider Notes (Signed)
EMERGENCY DEPARTMENT HISTORY AND PHYSICAL EXAM      Date: 05/31/2019  [facility]    History of Presenting Illness     Chief Complaint   Patient presents with   ??? Head Injury       History Provided By: Patient    HPI: Milus Banisteraveda Dahmen, 28 y.o. female with No significant past medical history presents to the ED with cc of head injury.  She reports ceiling tiles fell on her while she was showering today at the hotel she is residing in.  She denies LOC but states she was knocked to the ground. She is complaining of right neck, shoulder and upper chest pain since.  She denies any dizziness, headache or N/V.  She has not tried taking anything for discomfort    There are no other complaints, changes, or physical findings at this time.    PCP: None    No current facility-administered medications on file prior to encounter.      No current outpatient medications on file prior to encounter.       Past History     Past Medical History:  Past Medical History:   Diagnosis Date   ??? DVT (deep venous thrombosis) (HCC)        Past Surgical History:  History reviewed. No pertinent surgical history.    Family History:  History reviewed. No pertinent family history.    Social History:  Social History     Tobacco Use   ??? Smoking status: Former Smoker   ??? Smokeless tobacco: Never Used   Substance Use Topics   ??? Alcohol use: Not Currently   ??? Drug use: Not on file       Allergies:  Allergies   Allergen Reactions   ??? Latex Unknown (comments)   ??? Penicillins Unknown (comments)         Review of Systems     Review of Systems   Constitutional: Negative.  Negative for chills, fatigue and fever.   Respiratory: Negative.  Negative for cough and shortness of breath.    Cardiovascular: Positive for chest pain. Negative for palpitations.   Gastrointestinal: Negative.  Negative for abdominal pain, blood in stool, constipation, diarrhea, nausea and vomiting.   Genitourinary: Negative.  Negative for dysuria and hematuria.    Musculoskeletal: Positive for arthralgias, joint swelling and neck pain.   Skin: Negative.  Negative for rash.   Neurological: Negative.  Negative for dizziness, speech difficulty, weakness, light-headedness and headaches.   Psychiatric/Behavioral: Negative.  Negative for sleep disturbance and suicidal ideas.   All other systems reviewed and are negative.      Physical Exam     Physical Exam  Vitals signs and nursing note reviewed.   Constitutional:       General: She is not in acute distress.     Appearance: Normal appearance.   HENT:      Head: Normocephalic and atraumatic.   Eyes:      General: Vision grossly intact. Gaze aligned appropriately.      Extraocular Movements: Extraocular movements intact.      Right eye: No nystagmus.      Left eye: No nystagmus.      Conjunctiva/sclera: Conjunctivae normal.      Right eye: No hemorrhage.     Left eye: No hemorrhage.  Neck:      Musculoskeletal: Neck supple. Decreased range of motion. Injury, pain with movement and muscular tenderness present. No edema, erythema, neck rigidity, crepitus  or spinous process tenderness.   Cardiovascular:      Rate and Rhythm: Normal rate and regular rhythm.      Pulses: Normal pulses.      Heart sounds: Normal heart sounds. No murmur.   Pulmonary:      Effort: Pulmonary effort is normal.      Breath sounds: Normal breath sounds. No wheezing or rales.   Chest:      Chest wall: Tenderness present. No lacerations, deformity, swelling or crepitus.   Abdominal:      General: Bowel sounds are normal.      Palpations: Abdomen is soft.      Tenderness: There is no abdominal tenderness. There is no right CVA tenderness or left CVA tenderness.   Musculoskeletal:      Right shoulder: She exhibits tenderness. She exhibits normal range of motion, no swelling, no crepitus, no deformity, no laceration and normal strength.      Right lower leg: No edema.      Left lower leg: No edema.   Skin:     General: Skin is warm and dry.      Findings: No rash.    Neurological:      General: No focal deficit present.      Mental Status: She is alert.      GCS: GCS eye subscore is 4. GCS verbal subscore is 5. GCS motor subscore is 6.      Cranial Nerves: Cranial nerves are intact.      Sensory: Sensation is intact.      Motor: Motor function is intact.      Coordination: Coordination is intact.      Gait: Gait is intact.   Psychiatric:         Mood and Affect: Mood normal.         Behavior: Behavior normal. Behavior is cooperative.         Diagnostic Study Results     Labs -     No results found for this or any previous visit (from the past 12 hour(s)).    Radiologic Studies -     XR Results (maximum last 3):  Results from Hospital Encounter encounter on 05/31/19   XR SHOULDER RT AP/LAT MIN 2 V    Narrative Injury.      Impression FINDINGS: Impression: 3 views of the right shoulder. No acute fracture or  dislocation. Joints maintained. No significant soft tissue swelling.       CT Results (maximum last 3):  Results from Hospital Encounter encounter on 05/31/19   CT SPINE CERV WO CONT    Narrative Injury.    No comparison.    Technique: Axial images cervical spine with sagittal and coronal reformats.  Dose reduction: All CT scans at this facility are performed using dose reduction  optimization techniques as appropriate to a performed exam including the  following: Automated exposure control, adjustments of the mA and/or kV according  to patient's size, or use of iterative reconstruction technique.    FINDINGS: There is loss of the normal cervical lordosis. No listhesis. Vertebral  body heights and disc spaces are maintained. No acute fracture or facet  malalignment. Lateral pillars symmetric bilaterally. Odontoid is intact. No  prevertebral soft tissue swelling. Lung apices clear.      Impression IMPRESSION:  1. No acute bony findings.  2. Loss of the normal cervical lordosis can be seen with ligamentous injury or  muscular spasm, or simply be positional.  CT HEAD WO CONT     Narrative Head injury.    No comparison.    Technique: Axial images  head without IV contrast. Multiplanar reformatting  performed.  Dose reduction: All CT scans at this facility are performed using dose reduction  optimization techniques as appropriate to a performed exam including the  following: Automated exposure control, adjustments of the mA and/or kV according  to patient's size, or use of iterative reconstruction technique.    Findings: No abnormal brain density. No  mass effect, extra-axial fluid  collection, hemorrhage. CSF-containing structures normal size, shape, position.  No   calvarial fractures. Visualized facial sinuses unopacified.      Impression Impression:   1. No acute intracranial findings.         Medical Decision Making   I am the first provider for this patient.    I reviewed the vital signs, available nursing notes, past medical history, past surgical history, family history and social history.    Vital Signs-Reviewed the patient's vital signs.  No data found.    Records Reviewed: Nursing Notes    The patient presents with head injury with a differential diagnosis of ICH, cervical strain/fx, contusion      Provider Notes (Medical Decision Making):   Discussed findings with pt and normal expectations post traumatic event, discussed RICE; imaging negative, will d/c with muscle relaxer, OTC tylenol/IBU as needed  MDM         ED Course:   Initial assessment performed. The patients presenting problems have been discussed, and they are in agreement with the care plan formulated and outlined with them.  I have encouraged them to ask questions as they arise throughout their visit.           PLAN:  1.   Discharge Medication List as of 05/31/2019  1:49 AM       flexeril 10mg  TID PRN   OTC tylenol   2.   Follow-up Information     Follow up With Specialties Details Why [place]    [facility] Emergency Medicine  If symptoms worsen [place]    Darlina Rumpf, MD Orthopedic Surgery, Sports Medicine  orthopedics for continued pain [address]  [address]  [place] [address]  [phone]      [place]  Schedule an appointment as soon as possible for a visit in 2 days  [place]  [phone]        Return to ED if worse     Diagnosis     Clinical Impression:   1. Minor head injury, initial encounter    2. Contusion of right chest wall, initial encounter    3. Strain of neck muscle, initial encounter

## 2019-08-06 ENCOUNTER — Inpatient Hospital Stay (HOSPITAL_COMMUNITY)
Admission: AD | Admit: 2019-08-06 | Discharge: 2019-08-06 | Disposition: A | Discharge status: Home / Self Care | Payer: Medicaid Other | Attending: Obstetrics & Gynecology | Admitting: Obstetrics & Gynecology

## 2019-08-06 ENCOUNTER — Other Ambulatory Visit: Payer: Self-pay

## 2019-08-06 ENCOUNTER — Encounter (HOSPITAL_COMMUNITY): Payer: Self-pay | Admitting: *Deleted

## 2019-08-06 DIAGNOSIS — F17210 Nicotine dependence, cigarettes, uncomplicated: Secondary | ICD-10-CM | POA: Insufficient documentation

## 2019-08-06 DIAGNOSIS — N939 Abnormal uterine and vaginal bleeding, unspecified: Secondary | ICD-10-CM | POA: Diagnosis not present

## 2019-08-06 DIAGNOSIS — Z880 Allergy status to penicillin: Secondary | ICD-10-CM | POA: Insufficient documentation

## 2019-08-06 DIAGNOSIS — O209 Hemorrhage in early pregnancy, unspecified: Secondary | ICD-10-CM

## 2019-08-06 DIAGNOSIS — Z3202 Encounter for pregnancy test, result negative: Secondary | ICD-10-CM | POA: Insufficient documentation

## 2019-08-06 DIAGNOSIS — O26859 Spotting complicating pregnancy, unspecified trimester: Secondary | ICD-10-CM | POA: Diagnosis present

## 2019-08-06 LAB — WET PREP, GENITAL
Sperm: NONE SEEN
Trich, Wet Prep: NONE SEEN
Yeast Wet Prep HPF POC: NONE SEEN

## 2019-08-06 LAB — CBC
HCT: 43.1 % (ref 36.0–46.0)
Hemoglobin: 14.3 g/dL (ref 12.0–15.0)
MCH: 28.9 pg (ref 26.0–34.0)
MCHC: 33.2 g/dL (ref 30.0–36.0)
MCV: 87.2 fL (ref 80.0–100.0)
Platelets: 274 10*3/uL (ref 150–400)
RBC: 4.94 MIL/uL (ref 3.87–5.11)
RDW: 13.3 % (ref 11.5–15.5)
WBC: 10.9 10*3/uL — ABNORMAL HIGH (ref 4.0–10.5)
nRBC: 0 % (ref 0.0–0.2)

## 2019-08-06 LAB — HCG, QUANTITATIVE, PREGNANCY: hCG, Beta Chain, Quant, S: <1 m[IU]/mL (ref <5)

## 2019-08-06 NOTE — MAU Note (Signed)
Mariah Mccarthy is a 28 y.o. at Unknown here in MAU reporting: has been cramping for a week and then had some spotting a few days ago that stopped. Woke up this AM with a sharp stabbing pain and heavier bleeding. Has changed a pad twice today so far. Has had pregnancy confirmed at a clinic 2 weeks ago.   LMP: 06/14/19  Onset of complaint: ongoing but worse today  Pain score: 8/10  Vitals:   08/06/19 0907 08/06/19 1034  BP: 119/71 119/69  Pulse: 82 74  Resp: 16 16  Temp: 98.6 F (37 C) 98.2 F (36.8 C)  SpO2: 100% 100%     Lab orders placed from triage: UPT

## 2019-08-06 NOTE — ED Notes (Signed)
Report to [facility] in MAU.  Transport notified of pt.

## 2019-08-08 LAB — GC/CHLAMYDIA PROBE AMP (~~LOC~~) NOT AT ARMC
Chlamydia: POSITIVE — AB
Comment: NEGATIVE
Comment: NORMAL
Neisseria Gonorrhea: NEGATIVE

## 2019-08-09 ENCOUNTER — Telehealth: Payer: Self-pay | Admitting: Advanced Practice Midwife

## 2019-08-09 MED ORDER — AZITHROMYCIN 500 MG PO TABS: 1000.0000 mg | ORAL_TABLET | Freq: Once | ORAL | 1 refills | Status: AC

## 2019-08-09 NOTE — Telephone Encounter (Signed)
Called pt to inform her of positive chlamydia test on 08/06/19.  Pt answered phone and 2 identifiers used to verify pt.  Rx for azithromycin sent to pt preferred pharmacy.  Pt desires expedited partner therapy for Mariah Mccarthy so will write paper Rx for him/her to pick up at [facility] today.  Pt states understanding.
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
## 2019-08-10 LAB — POCT PREGNANCY, URINE: Preg Test, Ur: NEGATIVE
